# Patient Record
Sex: Female | Born: 1975 | Race: White | Hispanic: No | Marital: Married | Smoking: Former smoker
Health system: Southern US, Community
[De-identification: ages and names within clinical notes are randomized; demographics above are authoritative.]

## PROBLEM LIST (undated history)

## (undated) DIAGNOSIS — K219 Gastro-esophageal reflux disease without esophagitis: Secondary | ICD-10-CM

## (undated) DIAGNOSIS — E282 Polycystic ovarian syndrome: Secondary | ICD-10-CM

## (undated) DIAGNOSIS — Z87442 Personal history of urinary calculi: Secondary | ICD-10-CM

## (undated) DIAGNOSIS — R112 Nausea with vomiting, unspecified: Secondary | ICD-10-CM

## (undated) DIAGNOSIS — K8020 Calculus of gallbladder without cholecystitis without obstruction: Secondary | ICD-10-CM

## (undated) DIAGNOSIS — Z9889 Other specified postprocedural states: Secondary | ICD-10-CM

## (undated) HISTORY — PX: LITHOTRIPSY: SUR834

## (undated) HISTORY — PX: TUBAL LIGATION: SHX77

## (undated) HISTORY — PX: CHOLECYSTECTOMY: SHX55

## (undated) HISTORY — PX: ABLATION: SHX5711

## (undated) HISTORY — PX: DILATION AND CURETTAGE OF UTERUS: SHX78

## (undated) HISTORY — DX: Calculus of gallbladder without cholecystitis without obstruction: K80.20

---

## 1998-02-28 HISTORY — PX: CHOLECYSTECTOMY: SHX55

## 1998-05-15 ENCOUNTER — Other Ambulatory Visit: Admission: RE | Admit: 1998-05-15 | Discharge: 1998-05-15 | Payer: Self-pay | Admitting: Obstetrics & Gynecology

## 1999-01-23 ENCOUNTER — Emergency Department (HOSPITAL_COMMUNITY): Admission: EM | Admit: 1999-01-23 | Discharge: 1999-01-23 | Payer: Self-pay | Admitting: *Deleted

## 1999-01-26 ENCOUNTER — Emergency Department (HOSPITAL_COMMUNITY): Admission: EM | Admit: 1999-01-26 | Discharge: 1999-01-26 | Payer: Self-pay | Admitting: Emergency Medicine

## 1999-01-26 ENCOUNTER — Encounter: Payer: Self-pay | Admitting: Emergency Medicine

## 1999-01-27 ENCOUNTER — Encounter: Payer: Self-pay | Admitting: General Surgery

## 1999-01-27 ENCOUNTER — Encounter (INDEPENDENT_AMBULATORY_CARE_PROVIDER_SITE_OTHER): Payer: Self-pay

## 1999-01-27 ENCOUNTER — Observation Stay (HOSPITAL_COMMUNITY): Admission: RE | Admit: 1999-01-27 | Discharge: 1999-01-28 | Payer: Self-pay | Admitting: General Surgery

## 1999-10-08 ENCOUNTER — Other Ambulatory Visit: Admission: RE | Admit: 1999-10-08 | Discharge: 1999-10-08 | Payer: Self-pay | Admitting: Obstetrics and Gynecology

## 1999-10-19 ENCOUNTER — Encounter: Admission: RE | Admit: 1999-10-19 | Discharge: 1999-10-19 | Payer: Self-pay | Admitting: Urology

## 1999-10-19 ENCOUNTER — Encounter: Payer: Self-pay | Admitting: Urology

## 1999-10-26 ENCOUNTER — Encounter: Admission: RE | Admit: 1999-10-26 | Discharge: 1999-10-26 | Payer: Self-pay | Admitting: Urology

## 1999-10-26 ENCOUNTER — Encounter: Payer: Self-pay | Admitting: Urology

## 2000-11-07 ENCOUNTER — Other Ambulatory Visit: Admission: RE | Admit: 2000-11-07 | Discharge: 2000-11-07 | Payer: Self-pay | Admitting: Obstetrics and Gynecology

## 2001-03-04 ENCOUNTER — Emergency Department (HOSPITAL_COMMUNITY): Admission: EM | Admit: 2001-03-04 | Discharge: 2001-03-04 | Payer: Self-pay | Admitting: Emergency Medicine

## 2001-03-04 ENCOUNTER — Encounter: Payer: Self-pay | Admitting: Emergency Medicine

## 2001-07-22 ENCOUNTER — Emergency Department (HOSPITAL_COMMUNITY): Admission: EM | Admit: 2001-07-22 | Discharge: 2001-07-22 | Payer: Self-pay | Admitting: Emergency Medicine

## 2001-08-29 ENCOUNTER — Inpatient Hospital Stay (HOSPITAL_COMMUNITY): Admission: AD | Admit: 2001-08-29 | Discharge: 2001-08-29 | Payer: Self-pay | Admitting: Obstetrics and Gynecology

## 2001-11-01 ENCOUNTER — Other Ambulatory Visit: Admission: RE | Admit: 2001-11-01 | Discharge: 2001-11-01 | Payer: Self-pay | Admitting: Obstetrics and Gynecology

## 2002-06-12 ENCOUNTER — Encounter: Payer: Self-pay | Admitting: Orthodontics and Dentofacial Orthopedics

## 2002-06-12 ENCOUNTER — Ambulatory Visit (HOSPITAL_COMMUNITY)
Admission: RE | Admit: 2002-06-12 | Discharge: 2002-06-12 | Payer: Self-pay | Admitting: Orthodontics and Dentofacial Orthopedics

## 2002-06-23 ENCOUNTER — Emergency Department (HOSPITAL_COMMUNITY): Admission: EM | Admit: 2002-06-23 | Discharge: 2002-06-23 | Payer: Self-pay | Admitting: Emergency Medicine

## 2002-09-14 ENCOUNTER — Emergency Department (HOSPITAL_COMMUNITY): Admission: AD | Admit: 2002-09-14 | Discharge: 2002-09-14 | Payer: Self-pay | Admitting: Emergency Medicine

## 2002-09-14 ENCOUNTER — Encounter: Payer: Self-pay | Admitting: Emergency Medicine

## 2003-02-04 ENCOUNTER — Other Ambulatory Visit: Admission: RE | Admit: 2003-02-04 | Discharge: 2003-02-04 | Payer: Self-pay | Admitting: Obstetrics and Gynecology

## 2003-03-01 HISTORY — PX: TUBAL LIGATION: SHX77

## 2003-07-04 ENCOUNTER — Inpatient Hospital Stay (HOSPITAL_COMMUNITY): Admission: AD | Admit: 2003-07-04 | Discharge: 2003-07-04 | Payer: Self-pay | Admitting: Obstetrics and Gynecology

## 2003-08-06 ENCOUNTER — Inpatient Hospital Stay (HOSPITAL_COMMUNITY): Admission: AD | Admit: 2003-08-06 | Discharge: 2003-08-06 | Payer: Self-pay | Admitting: Obstetrics and Gynecology

## 2003-10-13 ENCOUNTER — Inpatient Hospital Stay (HOSPITAL_COMMUNITY): Admission: AD | Admit: 2003-10-13 | Discharge: 2003-10-13 | Payer: Self-pay | Admitting: Obstetrics and Gynecology

## 2003-10-21 ENCOUNTER — Inpatient Hospital Stay (HOSPITAL_COMMUNITY): Admission: AD | Admit: 2003-10-21 | Discharge: 2003-10-23 | Payer: Self-pay | Admitting: Obstetrics and Gynecology

## 2003-10-22 ENCOUNTER — Encounter (INDEPENDENT_AMBULATORY_CARE_PROVIDER_SITE_OTHER): Payer: Self-pay | Admitting: Specialist

## 2003-12-08 ENCOUNTER — Other Ambulatory Visit: Admission: RE | Admit: 2003-12-08 | Discharge: 2003-12-08 | Payer: Self-pay | Admitting: Obstetrics and Gynecology

## 2004-06-21 ENCOUNTER — Ambulatory Visit (HOSPITAL_COMMUNITY): Admission: RE | Admit: 2004-06-21 | Discharge: 2004-06-21 | Payer: Self-pay | Admitting: Urology

## 2005-01-11 ENCOUNTER — Emergency Department (HOSPITAL_COMMUNITY): Admission: EM | Admit: 2005-01-11 | Discharge: 2005-01-11 | Payer: Self-pay | Admitting: Emergency Medicine

## 2007-07-02 ENCOUNTER — Emergency Department (HOSPITAL_COMMUNITY): Admission: EM | Admit: 2007-07-02 | Discharge: 2007-07-02 | Payer: Self-pay | Admitting: Emergency Medicine

## 2007-08-18 ENCOUNTER — Emergency Department (HOSPITAL_COMMUNITY): Admission: EM | Admit: 2007-08-18 | Discharge: 2007-08-18 | Payer: Self-pay | Admitting: Family Medicine

## 2008-09-01 ENCOUNTER — Emergency Department (HOSPITAL_COMMUNITY): Admission: EM | Admit: 2008-09-01 | Discharge: 2008-09-01 | Payer: Self-pay | Admitting: Emergency Medicine

## 2008-11-07 ENCOUNTER — Encounter (INDEPENDENT_AMBULATORY_CARE_PROVIDER_SITE_OTHER): Payer: Self-pay | Admitting: Obstetrics and Gynecology

## 2008-11-07 ENCOUNTER — Ambulatory Visit (HOSPITAL_COMMUNITY): Admission: RE | Admit: 2008-11-07 | Discharge: 2008-11-07 | Payer: Self-pay | Admitting: Obstetrics and Gynecology

## 2009-10-24 ENCOUNTER — Emergency Department (HOSPITAL_COMMUNITY): Admission: EM | Admit: 2009-10-24 | Discharge: 2009-10-24 | Payer: Self-pay | Admitting: Emergency Medicine

## 2010-05-13 LAB — CULTURE, ROUTINE-ABSCESS: Gram Stain: NONE SEEN

## 2010-06-04 LAB — CBC
MCHC: 33.5 g/dL (ref 30.0–36.0)
MCV: 93.6 fL (ref 78.0–100.0)
Platelets: 302 10*3/uL (ref 150–400)
WBC: 11.5 10*3/uL — ABNORMAL HIGH (ref 4.0–10.5)

## 2010-06-04 LAB — PREGNANCY, URINE: Preg Test, Ur: NEGATIVE

## 2010-07-16 NOTE — Op Note (Signed)
Melody Taylor, Melody Taylor                            ACCOUNT NO.:  [id number]   MEDICAL RECORD NO.:  [id number]                   PATIENT TYPE:  INP   LOCATION:  9102                                 FACILITY:  [facility]   PHYSICIAN:  Freddy Finner, M.D.                DATE OF BIRTH:  18-Apr-1975   DATE OF PROCEDURE:  10/22/2003  DATE OF DISCHARGE:  10/23/2003                                 OPERATIVE REPORT   PREOPERATIVE DIAGNOSIS:  Multiparity, requests postpartum tubal  sterilization.   POSTOPERATIVE DIAGNOSES:  1.  Multiparity, requests postpartum tubal sterilization.  2.  Adnexal adhesions.   OPERATIVE PROCEDURE:  Postpartum tubal ligation by Pomeroy technique with  lysis of adhesions to free up fallopian tubes.   SURGEON:  Varney Baas, M.D.   ANESTHESIA:  Epidural with IV sedation plus local block.   INTRAOPERATIVE COMPLICATIONS:  None.   The patient is a 35 year old who has requested postpartum sterilization.  This is her third viable pregnancy.  She was brought to the operating room  on the day after delivery and her epidural was redosed for surgery.  This  was partially effective and local block was carried out at the umbilicus  after prepped and draped the abdomen in the usual fashion.  The bladder was  evacuated prior to prepping the patient.  An elliptical incision was made in  the inferior umbilicus through an old scar.  This was carried sharply down  to fascia which was elevated with Allises and then and entered sharply.  The  peritoneum was also entered.  Some difficulty was experienced in exposing  the tubes.  This was felt to be due to adhesions holding the tube down  against the adnexa.  This was freed up with sharp and blunt dissection.  Neither ovary was palpably enlarged but was not easily visible, probably  also because of adhesions.  After freeing each tube up, the mid portion of  the tube was elevated with a Babcock, doubly ligated with 0 plain ties x2,  and a  segment of tube excised.  The mucosal segments of the tube distal to  the tie were fulgurated with the Bovie on each side.  The abdominal incision  was closed in layers.  A 0 chromic was used to close the peritoneum and  fascia in a single layer.  The skin was closed with a subcuticular suture of  3-0 Dexon.  The patient was taken to recovery in good condition.                                               Freddy Finner, M.D.    WRN/MEDQ  D:  11/05/2003  T:  11/05/2003  Job:  [id number]

## 2010-08-16 ENCOUNTER — Emergency Department (HOSPITAL_COMMUNITY)
Admission: EM | Admit: 2010-08-16 | Discharge: 2010-08-17 | Disposition: A | Discharge status: Home / Self Care | Payer: Self-pay | Attending: Emergency Medicine | Admitting: Emergency Medicine

## 2010-08-16 ENCOUNTER — Emergency Department (HOSPITAL_COMMUNITY): Payer: Self-pay

## 2010-08-16 DIAGNOSIS — R509 Fever, unspecified: Secondary | ICD-10-CM | POA: Insufficient documentation

## 2010-08-16 DIAGNOSIS — H9209 Otalgia, unspecified ear: Secondary | ICD-10-CM | POA: Insufficient documentation

## 2010-08-16 DIAGNOSIS — J029 Acute pharyngitis, unspecified: Secondary | ICD-10-CM | POA: Insufficient documentation

## 2010-08-16 DIAGNOSIS — R51 Headache: Secondary | ICD-10-CM | POA: Insufficient documentation

## 2010-08-16 LAB — DIFFERENTIAL
Basophils Relative: 0 % (ref 0–1)
Eosinophils Absolute: 0 10*3/uL (ref 0.0–0.7)
Lymphocytes Relative: 3 % — ABNORMAL LOW (ref 12–46)
Lymphs Abs: 0.5 10*3/uL — ABNORMAL LOW (ref 0.7–4.0)
Monocytes Relative: 6 % (ref 3–12)
Neutro Abs: 13.4 10*3/uL — ABNORMAL HIGH (ref 1.7–7.7)
Neutrophils Relative %: 90 % — ABNORMAL HIGH (ref 43–77)

## 2010-08-16 LAB — CK TOTAL AND CKMB (NOT AT ARMC)
CK, MB: 0.9 ng/mL (ref 0.3–4.0)
Total CK: 46 U/L (ref 7–177)

## 2010-08-16 LAB — CBC
Hemoglobin: 13 g/dL (ref 12.0–15.0)
MCH: 30 pg (ref 26.0–34.0)
MCHC: 34.3 g/dL (ref 30.0–36.0)
MCV: 87.3 fL (ref 78.0–100.0)
Platelets: 270 10*3/uL (ref 150–400)
RBC: 4.34 MIL/uL (ref 3.87–5.11)
RDW: 13.2 % (ref 11.5–15.5)

## 2010-08-16 LAB — BASIC METABOLIC PANEL
BUN: 8 mg/dL (ref 6–23)
CO2: 25 mEq/L (ref 19–32)
Creatinine, Ser: 0.6 mg/dL (ref 0.50–1.10)
GFR calc non Af Amer: >60 mL/min (ref >60)
Glucose, Bld: 104 mg/dL — ABNORMAL HIGH (ref 70–99)
Sodium: 137 mEq/L (ref 135–145)

## 2010-08-16 LAB — TROPONIN I: Troponin I: <0.3 ng/mL (ref <0.30)

## 2011-05-24 ENCOUNTER — Encounter (HOSPITAL_COMMUNITY): Payer: Self-pay | Admitting: *Deleted

## 2011-05-24 ENCOUNTER — Emergency Department (HOSPITAL_COMMUNITY)
Admission: EM | Admit: 2011-05-24 | Discharge: 2011-05-24 | Discharge status: Against Medical Advice | Payer: Self-pay | Attending: Emergency Medicine | Admitting: Emergency Medicine

## 2011-05-24 DIAGNOSIS — R109 Unspecified abdominal pain: Secondary | ICD-10-CM | POA: Insufficient documentation

## 2011-05-24 LAB — URINALYSIS, ROUTINE W REFLEX MICROSCOPIC
Glucose, UA: NEGATIVE mg/dL
Ketones, ur: NEGATIVE mg/dL
Protein, ur: NEGATIVE mg/dL
pH: 6 (ref 5.0–8.0)

## 2011-05-24 LAB — URINE MICROSCOPIC-ADD ON

## 2011-05-24 NOTE — ED Notes (Signed)
Patient reports onset of pain in her right side at 1345.  She states the pain has increased.  She has pain in her right groin and into her back.  Patient states she has hx of kidney stones.  She has not had problems for 8 years.  Patient denies diff voiding

## 2011-06-28 ENCOUNTER — Other Ambulatory Visit: Payer: Self-pay | Admitting: Obstetrics and Gynecology

## 2011-10-01 ENCOUNTER — Emergency Department (HOSPITAL_COMMUNITY)
Admission: EM | Admit: 2011-10-01 | Discharge: 2011-10-01 | Disposition: A | Discharge status: Home / Self Care | Payer: Self-pay | Attending: Emergency Medicine | Admitting: Emergency Medicine

## 2011-10-01 ENCOUNTER — Emergency Department (HOSPITAL_COMMUNITY): Payer: Self-pay

## 2011-10-01 ENCOUNTER — Encounter (HOSPITAL_COMMUNITY): Payer: Self-pay | Admitting: *Deleted

## 2011-10-01 DIAGNOSIS — S62329A Displaced fracture of shaft of unspecified metacarpal bone, initial encounter for closed fracture: Secondary | ICD-10-CM | POA: Insufficient documentation

## 2011-10-01 DIAGNOSIS — Z87442 Personal history of urinary calculi: Secondary | ICD-10-CM | POA: Insufficient documentation

## 2011-10-01 DIAGNOSIS — W010XXA Fall on same level from slipping, tripping and stumbling without subsequent striking against object, initial encounter: Secondary | ICD-10-CM | POA: Insufficient documentation

## 2011-10-01 DIAGNOSIS — Z9089 Acquired absence of other organs: Secondary | ICD-10-CM | POA: Insufficient documentation

## 2011-10-01 DIAGNOSIS — Y92009 Unspecified place in unspecified non-institutional (private) residence as the place of occurrence of the external cause: Secondary | ICD-10-CM | POA: Insufficient documentation

## 2011-10-01 MED ORDER — IBUPROFEN 800 MG PO TABS
800.0000 mg | ORAL_TABLET | Freq: Once | ORAL | Status: AC
  Administered 2011-10-01: 800 mg via ORAL
  Filled 2011-10-01: qty 1

## 2011-10-01 NOTE — ED Notes (Signed)
Ortho tech at bedside 

## 2011-10-01 NOTE — Progress Notes (Signed)
Orthopedic Tech Progress Note Patient Details:  Melody Taylor 12/11/1975 [id number]  Ortho Devices Type of Ortho Device: Ulna gutter splint;Ace wrap Ortho Device/Splint Location: left UE Ortho Device/Splint Interventions: Application   [name] 10/01/2011, 9:42 AM

## 2011-10-01 NOTE — ED Provider Notes (Signed)
Medical screening examination/treatment/procedure(s) were performed by non-physician practitioner and as supervising physician I was immediately available for consultation/collaboration.   David H Yao, MD 10/01/11 1009 

## 2011-10-01 NOTE — ED Notes (Signed)
Reports falling this am, having pain to left hand, ring finger and little finger, swelling noted. Pt reports unable to get her rings off pta, requesting that they be cut off at triage.

## 2011-10-01 NOTE — ED Provider Notes (Signed)
History     CSN: [id number]  Arrival date & time 10/01/11  0808   First MD Initiated Contact with Patient 10/01/11 [phone]      Chief Complaint  Patient presents with  . Hand Injury    (Consider location/radiation/quality/duration/timing/severity/associated sxs/prior treatment) HPI  Past Medical History  Diagnosis Date  . Kidney stones     Past Surgical History  Procedure Date  . Cholecystectomy   . Tubal ligation   . Tubal ligation   . Dilation and curettage of uterus     History reviewed. No pertinent family history.  History  Substance Use Topics  . Smoking status: Never Smoker   . Smokeless tobacco: Not on file  . Alcohol Use: No    OB History    Grav Para Term Preterm Abortions TAB SAB Ect Mult Living                  Review of Systems  Allergies  Codeine  Home Medications   Current Outpatient Rx  Name Route Sig Dispense Refill  . CETIRIZINE HCL 10 MG PO TABS Oral Take 10 mg by mouth daily.      BP 109/65  Pulse 95  Temp 98 F (36.7 C) (Oral)  Resp 18  SpO2 99%  LMP 09/24/2011  Physical Exam  ED Course  Procedures (including critical care time)  Labs Reviewed - No data to display Dg Hand Complete Left  10/01/2011  *RADIOLOGY REPORT*  Clinical Data: Fall, left hand pain/injury  LEFT HAND - COMPLETE 3+ VIEW  Comparison: None.  Findings: Mildly comminuted, nondisplaced fracture involving the fifth metacarpal shaft.  No additional fracture is seen.  The joint spaces are preserved.  Visualized soft tissues are grossly unremarkable.  IMPRESSION: Mildly comminuted, nondisplaced fracture involving the fifth metacarpal shaft.  Original Report Authenticated By: Charline Bills, M.D.     1. Fracture, metacarpal shaft       MDM  Please see midlevel note.         Richardean Canal, MD 10/01/11 [phone]
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
## 2012-02-06 ENCOUNTER — Emergency Department (HOSPITAL_COMMUNITY)
Admission: EM | Admit: 2012-02-06 | Discharge: 2012-02-06 | Disposition: A | Discharge status: Home / Self Care | Payer: Self-pay | Attending: Emergency Medicine | Admitting: Emergency Medicine

## 2012-02-06 ENCOUNTER — Emergency Department (HOSPITAL_COMMUNITY): Payer: Self-pay

## 2012-02-06 ENCOUNTER — Encounter (HOSPITAL_COMMUNITY): Payer: Self-pay | Admitting: Emergency Medicine

## 2012-02-06 DIAGNOSIS — R110 Nausea: Secondary | ICD-10-CM | POA: Insufficient documentation

## 2012-02-06 DIAGNOSIS — K219 Gastro-esophageal reflux disease without esophagitis: Secondary | ICD-10-CM | POA: Insufficient documentation

## 2012-02-06 DIAGNOSIS — Z79899 Other long term (current) drug therapy: Secondary | ICD-10-CM | POA: Insufficient documentation

## 2012-02-06 DIAGNOSIS — Z87442 Personal history of urinary calculi: Secondary | ICD-10-CM | POA: Insufficient documentation

## 2012-02-06 DIAGNOSIS — Z3202 Encounter for pregnancy test, result negative: Secondary | ICD-10-CM | POA: Insufficient documentation

## 2012-02-06 LAB — CBC WITH DIFFERENTIAL/PLATELET
Basophils Absolute: 0 10*3/uL (ref 0.0–0.1)
Basophils Relative: 0 % (ref 0–1)
Eosinophils Absolute: 0.1 10*3/uL (ref 0.0–0.7)
HCT: 38.6 % (ref 36.0–46.0)
MCH: 29.8 pg (ref 26.0–34.0)
MCHC: 33.2 g/dL (ref 30.0–36.0)
Monocytes Relative: 4 % (ref 3–12)
Neutro Abs: 6.8 10*3/uL (ref 1.7–7.7)
Neutrophils Relative %: 75 % (ref 43–77)
Platelets: 344 10*3/uL (ref 150–400)
RDW: 12.9 % (ref 11.5–15.5)
WBC: 9 10*3/uL (ref 4.0–10.5)

## 2012-02-06 LAB — COMPREHENSIVE METABOLIC PANEL
AST: 12 U/L (ref 0–37)
Albumin: 3.2 g/dL — ABNORMAL LOW (ref 3.5–5.2)
Alkaline Phosphatase: 69 U/L (ref 39–117)
BUN: 9 mg/dL (ref 6–23)
Chloride: 106 mEq/L (ref 96–112)
Creatinine, Ser: 0.61 mg/dL (ref 0.50–1.10)
GFR calc Af Amer: >90 mL/min (ref >90)
Glucose, Bld: 110 mg/dL — ABNORMAL HIGH (ref 70–99)
Potassium: 3.7 mEq/L (ref 3.5–5.1)
Total Protein: 6.9 g/dL (ref 6.0–8.3)

## 2012-02-06 LAB — TROPONIN I: Troponin I: <0.3 ng/mL (ref <0.30)

## 2012-02-06 LAB — URINALYSIS, ROUTINE W REFLEX MICROSCOPIC
Nitrite: NEGATIVE
Specific Gravity, Urine: 1.018 (ref 1.005–1.030)
Urobilinogen, UA: 0.2 mg/dL (ref 0.0–1.0)
pH: 6 (ref 5.0–8.0)

## 2012-02-06 LAB — URINE MICROSCOPIC-ADD ON

## 2012-02-06 LAB — POCT PREGNANCY, URINE: Preg Test, Ur: NEGATIVE

## 2012-02-06 LAB — LIPASE, BLOOD: Lipase: 15 U/L (ref 11–59)

## 2012-02-06 MED ORDER — PANTOPRAZOLE SODIUM 20 MG PO TBEC: 40.0000 mg | DELAYED_RELEASE_TABLET | Freq: Every day | ORAL | Status: DC

## 2012-02-06 MED ORDER — ONDANSETRON HCL 4 MG PO TABS: 4.0000 mg | ORAL_TABLET | Freq: Four times a day (QID) | ORAL | Status: DC

## 2012-02-06 MED ORDER — GI COCKTAIL ~~LOC~~
30.0000 mL | Freq: Once | ORAL | Status: AC
  Administered 2012-02-06: 30 mL via ORAL
  Filled 2012-02-06: qty 30

## 2012-02-06 MED ORDER — MORPHINE SULFATE 4 MG/ML IJ SOLN: 4.0000 mg | Freq: Once | INTRAMUSCULAR | Status: DC

## 2012-02-06 MED ORDER — GI COCKTAIL ~~LOC~~: 30.0000 mL | Freq: Two times a day (BID) | ORAL | Status: DC

## 2012-02-06 MED ORDER — ONDANSETRON 4 MG PO TBDP
8.0000 mg | ORAL_TABLET | Freq: Once | ORAL | Status: AC
  Administered 2012-02-06: 8 mg via ORAL
  Filled 2012-02-06: qty 2

## 2012-02-06 NOTE — ED Notes (Signed)
Pt c/o 3 weeks of epigastric pain radiating around sides with nausea and dry heaves. Pain is intermittent and nausea is constant. Pt seen by PMD on Friday for same.

## 2012-02-06 NOTE — ED Notes (Signed)
Went into Patient room to do ECG.  Lab was in with drawing blood.  Patient has gone to [employer] [facility]

## 2012-02-06 NOTE — ED Provider Notes (Signed)
History     CSN: [id number]  Arrival date & time 02/06/12  4098   First MD Initiated Contact with Patient 02/06/12 [phone]      Chief Complaint  Patient presents with  . Abdominal Pain  . Nausea    (Consider location/radiation/quality/duration/timing/severity/associated sxs/prior treatment) The history is provided by the patient and medical records.    Melody Taylor is a 36 y.o. female  with a hx of kidney stones presents to the Emergency Department complaining of gradual, persistent, progressively worsening epigastric pain onset 3 weeks ago. Associated symptoms include nausea, acid wash, burning in the chest, feeling the need to vomit without actually doing so .  Nothing makes it better and coffee and some foods make it worse. Pt PCP gave Zofran which helps temporarily with nausea, but not other symptoms and Protonix.  Pt was told the protonix was pain medication and did not take any of it.  Also, she has taken no medication for any symptoms in the past 72 hours since they were not helping.   Pt denies cough, congestion, shortness of breath bleeding, h/x of anemia, early satiety, unexplained weight loss, progressive dysphagia, odynophagia, recurrent vomiting, family history of GI cancer, previous esophagogastric malignancy.  Pt describes her pain as a "knot" in her stomach, rated at a 6/10, non-radiating.  Pt is a nonsmoker, no Fhx of cancer.    Past Medical History  Diagnosis Date  . Kidney stones     Past Surgical History  Procedure Date  . Cholecystectomy   . Tubal ligation   . Tubal ligation   . Dilation and curettage of uterus   . Ablation     No family history on file.  History  Substance Use Topics  . Smoking status: Never Smoker   . Smokeless tobacco: Not on file  . Alcohol Use: No    OB History    Grav Para Term Preterm Abortions TAB SAB Ect Mult Living                  Review of Systems  Constitutional: Negative for fever, diaphoresis, appetite change,  fatigue and unexpected weight change.  HENT: Positive for sore throat. Negative for ear pain, congestion, rhinorrhea, mouth sores, neck pain, neck stiffness and postnasal drip.   Eyes: Negative for visual disturbance.  Respiratory: Negative for cough, chest tightness, shortness of breath and wheezing.   Cardiovascular: Negative for chest pain.       Burning sensation in her chest  Gastrointestinal: Positive for nausea and abdominal pain. Negative for vomiting, diarrhea and constipation.  Genitourinary: Negative for dysuria, urgency, frequency and hematuria.  Skin: Negative for rash.  Neurological: Negative for syncope, light-headedness and headaches.  Psychiatric/Behavioral: Negative for sleep disturbance. The patient is not nervous/anxious.     Allergies  Review of patient's allergies indicates no active allergies.  Home Medications   Current Outpatient Rx  Name  Route  Sig  Dispense  Refill  . GAVISCON EXTRA STRENGTH PO   Oral   Take 5 mLs by mouth 2 (two) times daily as needed. For indigestion         . METOCLOPRAMIDE HCL 5 MG PO TABS   Oral   Take 5 mg by mouth every 8 (eight) hours as needed. For nausea         . ONDANSETRON HCL 8 MG PO TABS   Oral   Take by mouth every 8 (eight) hours as needed. For nausea         .  ONDANSETRON 8 MG PO FILM   Oral   Take 1 each by mouth every 8 (eight) hours as needed. For nausea         . PANTOPRAZOLE SODIUM 40 MG PO TBEC   Oral   Take 40 mg by mouth daily.           BP 104/69  Pulse 64  Temp 99 F (37.2 C) (Oral)  Resp 21  SpO2 99%  Physical Exam  Nursing note and vitals reviewed. Constitutional: She is oriented to person, place, and time. She appears well-developed and well-nourished. No distress.  HENT:  Head: Normocephalic and atraumatic.  Right Ear: Tympanic membrane, external ear and ear canal normal.  Left Ear: Tympanic membrane, external ear and ear canal normal.  Nose: Nose normal. Right sinus exhibits  no maxillary sinus tenderness and no frontal sinus tenderness. Left sinus exhibits no maxillary sinus tenderness and no frontal sinus tenderness.  Mouth/Throat: Uvula is midline. Mucous membranes are not pale, not dry and not cyanotic. Posterior oropharyngeal erythema present. No oropharyngeal exudate, posterior oropharyngeal edema or tonsillar abscesses.  Eyes: Conjunctivae normal are normal. Pupils are equal, round, and reactive to light. No scleral icterus.  Neck: Normal range of motion. Neck supple.  Cardiovascular: Normal rate, regular rhythm, normal heart sounds and intact distal pulses.  Exam reveals no gallop and no friction rub.   No murmur heard. Pulmonary/Chest: Effort normal and breath sounds normal. No respiratory distress. She has no wheezes. She has no rales. She exhibits no tenderness.  Abdominal: Soft. Normal appearance and bowel sounds are normal. She exhibits no mass. There is tenderness (mild) in the epigastric area. There is no rigidity, no rebound, no guarding, no CVA tenderness, no tenderness at McBurney's point and negative Murphy's sign.  Musculoskeletal: Normal range of motion. She exhibits no edema and no tenderness.  Lymphadenopathy:    She has no cervical adenopathy.  Neurological: She is alert and oriented to person, place, and time. She exhibits normal muscle tone. Coordination normal.       Speech is clear and goal oriented Moves extremities without ataxia  Skin: Skin is warm and dry. No rash noted. She is not diaphoretic. No erythema.  Psychiatric: She has a normal mood and affect.    ED Course  Procedures (including critical care time)  Labs Reviewed  COMPREHENSIVE METABOLIC PANEL - Abnormal; Notable for the following:    Glucose, Bld 110 (*)     Albumin 3.2 (*)     Total Bilirubin 0.2 (*)     All other components within normal limits  URINALYSIS, ROUTINE W REFLEX MICROSCOPIC - Abnormal; Notable for the following:    Hgb urine dipstick SMALL (*)      Leukocytes, UA SMALL (*)     All other components within normal limits  URINE MICROSCOPIC-ADD ON - Abnormal; Notable for the following:    Squamous Epithelial / LPF FEW (*)     All other components within normal limits  CBC WITH DIFFERENTIAL  LIPASE, BLOOD  TROPONIN I  POCT PREGNANCY, URINE  URINE CULTURE   Results for orders placed during the hospital encounter of 02/06/12  CBC WITH DIFFERENTIAL      Component Value Range   WBC 9.0  4.0 - 10.5 K/uL   RBC 4.30  3.87 - 5.11 MIL/uL   Hemoglobin 12.8  12.0 - 15.0 g/dL   HCT 16.1  09.6 - 04.5 %   MCV 89.8  78.0 - 100.0 fL   MCH  29.8  26.0 - 34.0 pg   MCHC 33.2  30.0 - 36.0 g/dL   RDW 21.3  08.6 - 57.8 %   Platelets 344  150 - 400 K/uL   Neutrophils Relative 75  43 - 77 %   Neutro Abs 6.8  1.7 - 7.7 K/uL   Lymphocytes Relative 19  12 - 46 %   Lymphs Abs 1.7  0.7 - 4.0 K/uL   Monocytes Relative 4  3 - 12 %   Monocytes Absolute 0.4  0.1 - 1.0 K/uL   Eosinophils Relative 1  0 - 5 %   Eosinophils Absolute 0.1  0.0 - 0.7 K/uL   Basophils Relative 0  0 - 1 %   Basophils Absolute 0.0  0.0 - 0.1 K/uL  COMPREHENSIVE METABOLIC PANEL      Component Value Range   Sodium 139  135 - 145 mEq/L   Potassium 3.7  3.5 - 5.1 mEq/L   Chloride 106  96 - 112 mEq/L   CO2 24  19 - 32 mEq/L   Glucose, Bld 110 (*) 70 - 99 mg/dL   BUN 9  6 - 23 mg/dL   Creatinine, Ser 4.69  0.50 - 1.10 mg/dL   Calcium 8.8  8.4 - 62.9 mg/dL   Total Protein 6.9  6.0 - 8.3 g/dL   Albumin 3.2 (*) 3.5 - 5.2 g/dL   AST 12  0 - 37 U/L   ALT 21  0 - 35 U/L   Alkaline Phosphatase 69  39 - 117 U/L   Total Bilirubin 0.2 (*) 0.3 - 1.2 mg/dL   GFR calc non Af Amer >90  >90 mL/min   GFR calc Af Amer >90  >90 mL/min  LIPASE, BLOOD      Component Value Range   Lipase 15  11 - 59 U/L  TROPONIN I      Component Value Range   Troponin I <0.30  <0.30 ng/mL  URINALYSIS, ROUTINE W REFLEX MICROSCOPIC      Component Value Range   Color, Urine YELLOW  YELLOW   APPearance CLEAR   CLEAR   Specific Gravity, Urine 1.018  1.005 - 1.030   pH 6.0  5.0 - 8.0   Glucose, UA NEGATIVE  NEGATIVE mg/dL   Hgb urine dipstick SMALL (*) NEGATIVE   Bilirubin Urine NEGATIVE  NEGATIVE   Ketones, ur NEGATIVE  NEGATIVE mg/dL   Protein, ur NEGATIVE  NEGATIVE mg/dL   Urobilinogen, UA 0.2  0.0 - 1.0 mg/dL   Nitrite NEGATIVE  NEGATIVE   Leukocytes, UA SMALL (*) NEGATIVE  POCT PREGNANCY, URINE      Component Value Range   Preg Test, Ur NEGATIVE  NEGATIVE  URINE MICROSCOPIC-ADD ON      Component Value Range   Squamous Epithelial / LPF FEW (*) RARE   WBC, UA 3-6  <3 WBC/hpf   RBC / HPF 0-2  <3 RBC/hpf   Bacteria, UA RARE  RARE   Dg Chest 2 View  02/06/2012  *RADIOLOGY REPORT*  Clinical Data: Mid chest and epigastric discomfort with nausea for 3 weeks.  Shortness of breath.  CHEST - 2 VIEW  Comparison: 08/16/2010 radiographs.  Findings:  The heart size and mediastinal contours are normal. The lungs are clear. There is no pleural effusion or pneumothorax. No acute osseous findings are identified.  IMPRESSION: Stable examination.  No active cardiopulmonary process.   Original Report Authenticated By: Carey Bullocks, M.D.    ECG:  Date: 02/06/2012  Rate: 68  Rhythm: normal sinus rhythm  QRS Axis: normal  Intervals: normal  ST/T Wave abnormalities: normal  Conduction Disutrbances:none  Narrative Interpretation: non-ischemic ECG  Old EKG Reviewed: changes noted and not tachycardic     1. Nausea alone   2. GERD (gastroesophageal reflux disease)       MDM  Melody Taylor presents with ssx of GERD.  Also, with the possibility of PUD vs H. [place].  GI cocktail and zofran with resolution of epigastric pain and nausea, but not of the "knot" feeling in her stomach.  Labs unremarkable, ECG non-ischemic.  Pt is not anemic, with low concern for bleeding ulcer.  No red flags for stricture, barrett's esophagus or significant esophagitis.  Likely uncontrolled GERD ssx 2/2 the nonuse of a PPI.   Discussed use of PPI, diet changes, exercise and weight loss.  Also discussed GI follow-up with potential for EGD and H. Pylori testing.  I have also discussed reasons to return immediately to the ER.  Patient expresses understanding and agrees with plan.  1. Medications: protonix, Zofran, Tums, usual home medications 2. Treatment: rest, drink plenty of fluids, take medications,  3. Follow Up: Please followup with your primary doctor for discussion of your diagnoses and further evaluation after today's visit; if you do not have a primary care doctor use the resource guide provided to find one; GI referral for further eval of GERD ssx       Dierdre Forth, PA-C 02/06/12 1253

## 2012-02-07 ENCOUNTER — Encounter: Payer: Self-pay | Admitting: Internal Medicine

## 2012-02-07 LAB — URINE CULTURE
Colony Count: NO GROWTH
Culture: NO GROWTH

## 2012-02-07 NOTE — ED Provider Notes (Signed)
Medical screening examination/treatment/procedure(s) were performed by non-physician practitioner and as supervising physician I was immediately available for consultation/collaboration.   Suzi Roots, MD 02/07/12 [phone]

## 2012-03-01 ENCOUNTER — Encounter: Payer: Self-pay | Admitting: Internal Medicine

## 2012-03-06 ENCOUNTER — Encounter: Payer: Self-pay | Admitting: Internal Medicine

## 2012-03-06 ENCOUNTER — Ambulatory Visit (INDEPENDENT_AMBULATORY_CARE_PROVIDER_SITE_OTHER): Payer: Self-pay | Admitting: Internal Medicine

## 2012-03-06 VITALS — BP 118/78 | HR 78 | Ht 69.0 in | Wt 196.6 lb

## 2012-03-06 DIAGNOSIS — Z9049 Acquired absence of other specified parts of digestive tract: Secondary | ICD-10-CM | POA: Insufficient documentation

## 2012-03-06 DIAGNOSIS — Z87442 Personal history of urinary calculi: Secondary | ICD-10-CM | POA: Insufficient documentation

## 2012-03-06 DIAGNOSIS — K219 Gastro-esophageal reflux disease without esophagitis: Secondary | ICD-10-CM

## 2012-03-06 DIAGNOSIS — R1013 Epigastric pain: Secondary | ICD-10-CM

## 2012-03-06 MED ORDER — PROMETHAZINE HCL 12.5 MG PO TABS: 12.5000 mg | ORAL_TABLET | Freq: Four times a day (QID) | ORAL | Status: DC | PRN

## 2012-03-06 MED ORDER — PANTOPRAZOLE SODIUM 40 MG PO TBEC: 40.0000 mg | DELAYED_RELEASE_TABLET | Freq: Two times a day (BID) | ORAL | Status: DC

## 2012-03-06 NOTE — Patient Instructions (Addendum)
You have been scheduled for an endoscopy with propofol. Please follow written instructions given to you at your visit today. If you use inhalers (even only as needed) or a CPAP machine, please bring them with you on the day of your procedure.   We have sent the following medications to your pharmacy for you to pick up at your convenience: Protonix; take twice a day, Promethazine; take as directed  Stop taking Zofran and Reglan

## 2012-03-06 NOTE — Progress Notes (Signed)
Patient ID: Melody Taylor, female   DOB: 10-31-1975, 37 y.o.   MRN: [id number]  SUBJECTIVE: HPI Melody Taylor is a 37 year old female with a past medical history of kidney stones and gallstones status post cholecystectomy in 2000 who is seen in consultation at request of Dr. Margretta Ditty for evaluation of epigastric abdominal pain and heartburn.  The patient reports her symptoms started around Thanksgiving of 2013. Prior to this heartburn and dyspepsia were not a big issue for her. Initially her symptoms started his nausea and progressed epigastric abdominal pain. She reports epigastric pain is felt like "fire". She has not ever had an issue with vomiting. Her symptoms have been worse with eating, and in fact she has avoided food to try to avoid the symptoms. Her sleep has been corrected by the burning epigastric pain.  Initially she was given a prescription for pantoprazole as well as GI cocktail which seemed to help, but over the last week it has lost its efficacy. She is taking pantoprazole 40 mg daily. She does report use of Excedrin occasionally for headaches. She was also given prescriptions for ondansetron metoclopramide for nausea, but she has not found these helpful, but only sedating. Her bowel habits have been regular without blood or melena. No fevers or chills. No mouth ulcers. No rashes. When asked, her symptoms do resemble her prior gallbladder dysfunction, but seem to be more severe.  Review of Systems  As per history of present illness, otherwise negative   Past Medical History  Diagnosis Date  . Kidney stones   . Gallstones     Current Outpatient Prescriptions  Medication Sig Dispense Refill  . Alum & Mag Hydroxide-Simeth (GI COCKTAIL) SUSP suspension Take 30 mLs by mouth 2 (two) times daily. Shake well.  30 mL  1  . Alum Hydroxide-Mag Carbonate (GAVISCON EXTRA STRENGTH PO) Take 5 mLs by mouth 2 (two) times daily as needed. For indigestion      . pantoprazole (PROTONIX) 40 MG tablet  Take 1 tablet (40 mg total) by mouth 2 (two) times daily.  60 tablet  3  . promethazine (PHENERGAN) 12.5 MG tablet Take 1 tablet (12.5 mg total) by mouth every 6 (six) hours as needed for nausea.  30 tablet  0  . sertraline (ZOLOFT) 25 MG tablet Take 25 mg by mouth daily.        No Active Allergies  Family History  Problem Relation Age of Onset  . Diabetes Maternal Aunt   . Heart disease Maternal Grandfather   . Heart disease Maternal Uncle     x 2  . Irritable bowel syndrome Mother   . Cancer Maternal Aunt     kidney    History  Substance Use Topics  . Smoking status: Former Smoker    Quit date: 03/06/2010  . Smokeless tobacco: Never Used  . Alcohol Use: No    OBJECTIVE: BP 118/78  Pulse 78  Ht 5\' 9"  (1.753 m)  Wt 196 lb 9.6 oz (89.177 kg)  BMI 29.03 kg/m2  SpO2 99% Constitutional: Well-developed and well-nourished. No distress. HEENT: Normocephalic and atraumatic. Oropharynx is clear and moist. No oropharyngeal exudate. Conjunctivae are normal. No scleral icterus. Neck: Neck supple. Trachea midline. Cardiovascular: Normal rate, regular rhythm and intact distal pulses. No M/R/G Pulmonary/chest: Effort normal and breath sounds normal. No wheezing, rales or rhonchi. Abdominal: Soft, epigastric abdominal pain without rebound or guarding, nondistended. Bowel sounds active throughout. There are no masses palpable. No hepatosplenomegaly. Well-healed laparoscopic scar Extremities: no clubbing, cyanosis,  or edema Lymphadenopathy: No cervical adenopathy noted. Neurological: Alert and oriented to person place and time. Skin: Skin is warm and dry. No rashes noted. Psychiatric: Normal mood and affect. Behavior is normal.  Labs and Imaging -- CBC    Component Value Date/Time   WBC 9.0 02/06/2012 1036   RBC 4.30 02/06/2012 1036   HGB 12.8 02/06/2012 1036   HCT 38.6 02/06/2012 1036   PLT 344 02/06/2012 1036   MCV 89.8 02/06/2012 1036   MCH 29.8 02/06/2012 1036   MCHC 33.2 02/06/2012  1036   RDW 12.9 02/06/2012 1036   LYMPHSABS 1.7 02/06/2012 1036   MONOABS 0.4 02/06/2012 1036   EOSABS 0.1 02/06/2012 1036   BASOSABS 0.0 02/06/2012 1036    CMP     Component Value Date/Time   NA 139 02/06/2012 1036   K 3.7 02/06/2012 1036   CL 106 02/06/2012 1036   CO2 24 02/06/2012 1036   GLUCOSE 110* 02/06/2012 1036   BUN 9 02/06/2012 1036   CREATININE 0.61 02/06/2012 1036   CALCIUM 8.8 02/06/2012 1036   PROT 6.9 02/06/2012 1036   ALBUMIN 3.2* 02/06/2012 1036   AST 12 02/06/2012 1036   ALT 21 02/06/2012 1036   ALKPHOS 69 02/06/2012 1036   BILITOT 0.2* 02/06/2012 1036   GFRNONAA >90 02/06/2012 1036   GFRAA >90 02/06/2012 1036    Lipase     Component Value Date/Time   LIPASE 15 02/06/2012 1036     ASSESSMENT AND PLAN: 37 year old female with a past medical history of kidney stones and gallstones status post cholecystectomy in 2000 who is seen in consultation at request of Dr. Margretta Ditty for evaluation of epigastric abdominal pain and heartburn.   1.  Dyspepsia/heartburn -- the patient's symptoms are consistent with ulcer-like dyspepsia. She has not had a food greatly from daily PPI, and for this reason I recommended direct visualization with upper endoscopy. We discussed the test today and she is agreeable to proceed. In the interim I would like to double her dose of pantoprazole to 40 mg twice daily, she is instructed to take this 30 minutes to one hour before her first and last meal of the day. Given her nonresponse to metoclopramide and ondansetron, these will be discontinued. I will give her prescription for promethazine 12.5 mg to be used as needed and as directed for nausea. I've also asked that she try to avoid Excedrin as the aspirin in this product can worsen gastritis or ulcer disease. Further recommendations after endoscopy.

## 2012-03-08 ENCOUNTER — Ambulatory Visit (AMBULATORY_SURGERY_CENTER): Payer: Self-pay | Admitting: Internal Medicine

## 2012-03-08 ENCOUNTER — Encounter: Payer: Self-pay | Admitting: Internal Medicine

## 2012-03-08 VITALS — BP 111/75 | HR 65 | Temp 98.0°F | Resp 24 | Ht 60.0 in | Wt 196.0 lb

## 2012-03-08 DIAGNOSIS — K3189 Other diseases of stomach and duodenum: Secondary | ICD-10-CM

## 2012-03-08 DIAGNOSIS — K319 Disease of stomach and duodenum, unspecified: Secondary | ICD-10-CM

## 2012-03-08 DIAGNOSIS — K2970 Gastritis, unspecified, without bleeding: Secondary | ICD-10-CM

## 2012-03-08 DIAGNOSIS — R1013 Epigastric pain: Secondary | ICD-10-CM

## 2012-03-08 DIAGNOSIS — K2990 Gastroduodenitis, unspecified, without bleeding: Secondary | ICD-10-CM

## 2012-03-08 MED ORDER — SUCRALFATE 1 G PO TABS: 1.0000 g | ORAL_TABLET | Freq: Three times a day (TID) | ORAL | Status: DC

## 2012-03-08 MED ORDER — SODIUM CHLORIDE 0.9 % IV SOLN: 500.0000 mL | INTRAVENOUS | Status: DC

## 2012-03-08 NOTE — Progress Notes (Signed)
I asked the pt how her nausea was...she said nausea was better.  Pt has not passing any flatus yet.  Encouraged her to pass flatus. Maw

## 2012-03-08 NOTE — Op Note (Signed)
[facility] [address] [place], [address]   ENDOSCOPY PROCEDURE REPORT  PATIENT: Melody, Taylor  MR#: [id number] BIRTHDATE: 1975-07-17 , 36  yrs. old GENDER: Female ENDOSCOPIST: Beverley Fiedler, MD REFERRED BY: PROCEDURE DATE:  03/08/2012 PROCEDURE:  EGD w/ biopsy for H.pylori ASA CLASS:     Class II INDICATIONS:  Heartburn.   Epigastric pain.   Dyspepsia. MEDICATIONS: MAC sedation, administered by CRNA and propofol (Diprivan) 250mg  IV TOPICAL ANESTHETIC: none  DESCRIPTION OF PROCEDURE: After the risks benefits and alternatives of the procedure were thoroughly explained, informed consent was obtained.  The LB GIF-H180 [id number] endoscope was introduced through the mouth and advanced to the second portion of the duodenum. Without limitations.  The instrument was slowly withdrawn as the mucosa was fully examined.     ESOPHAGUS: The mucosa of the esophagus appeared normal.   A normal Z-line was observed 35 cm from the incisors.  STOMACH: Erosive gastritis (inflammation) was found in the gastric antrum.  Biopsies were taken in the antrum and angularis.   The stomach otherwise appeared normal.  DUODENUM: Mild duodenal inflammation was found in the duodenal bulb. The duodenal mucosa showed no abnormalities in the 2nd part of the duodenum.  Retroflexed views revealed no abnormalities.     The scope was then withdrawn from the patient and the procedure completed.  COMPLICATIONS: There were no complications.  ENDOSCOPIC IMPRESSION: 1.   The mucosa of the esophagus appeared normal 2.   Normal Z-line was observed 35 cm from the incisors 3.   Erosive gastritis (inflammation) was found in the gastric antrum; biopsies were taken in the antrum and angularis 4.   The stomach otherwise appeared normal 5.   Duodenal inflammation was found in the duodenal bulb 6.   The duodenal mucosa showed no abnormalities in the 2nd part of the  duodenum    RECOMMENDATIONS: 1.  Await pathology results 2.  Follow-up of helicobacter pylori status, treat if indicated 3.  Continue pantoprazole 40 mg twice daily for another 4 weeks.  If improved, then can decrease to once daily.  Remember to taken this medication 30 minutes to 1 hour before your 1st and last meal of the day. 4.  Can add sucralfate 1 g three times daily before meals and at bedtime.   eSigned:  Beverley Fiedler, MD 03/08/2012 8:49 AM   CC: The Patient  PATIENT NAME:  Melody, Taylor MR#: [id number]

## 2012-03-08 NOTE — Patient Instructions (Addendum)
Per Dr. Rhea Belton continue pantpprazole 40 mg 2 x per day for 4 weeks.  If improved, then can decrease to 1 x daily.  Remember to take this medication 30 minutes to 1 hour before your first meal of the day.  Can add sucralfate 1 gm 3 x daily before meals and at bedtime.  This was sent to your pharmacy.  Continue your other current medications today.  Please call if any questions or concerns.      YOU HAD AN ENDOSCOPIC PROCEDURE TODAY AT THE [facility]: Refer to the procedure report that was given to you for any specific questions about what was found during the examination.  If the procedure report does not answer your questions, please call your gastroenterologist to clarify.  If you requested that your care partner not be given the details of your procedure findings, then the procedure report has been included in a sealed envelope for you to review at your convenience later.  YOU SHOULD EXPECT: Some feelings of bloating in the abdomen. Passage of more gas than usual.  Walking can help get rid of the air that was put into your GI tract during the procedure and reduce the bloating. If you had a lower endoscopy (such as a colonoscopy or flexible sigmoidoscopy) you may notice spotting of blood in your stool or on the toilet paper. If you underwent a bowel prep for your procedure, then you may not have a normal bowel movement for a few days.  DIET: Your first meal following the procedure should be a light meal and then it is ok to progress to your normal diet.  A half-sandwich or bowl of soup is an example of a good first meal.  Heavy or fried foods are harder to digest and may make you feel nauseous or bloated.  Likewise meals heavy in dairy and vegetables can cause extra gas to form and this can also increase the bloating.  Drink plenty of fluids but you should avoid alcoholic beverages for 24 hours.  ACTIVITY: Your care partner should take you home directly after the procedure.  You should plan  to take it easy, moving slowly for the rest of the day.  You can resume normal activity the day after the procedure however you should NOT DRIVE or use heavy machinery for 24 hours (because of the sedation medicines used during the test).    SYMPTOMS TO REPORT IMMEDIATELY: A gastroenterologist can be reached at any hour.  During normal business hours, 8:30 AM to 5:00 PM Monday through Friday, call [phone].  After hours and on weekends, please call the GI answering service at [phone] who will take a message and have the physician on call contact you.     Following upper endoscopy (EGD)  Vomiting of blood or coffee ground material  New chest pain or pain under the shoulder blades  Painful or persistently difficult swallowing  New shortness of breath  Fever of 100F or higher  Black, tarry-looking stools  FOLLOW UP: If any biopsies were taken you will be contacted by phone or by letter within the next 1-3 weeks.  Call your gastroenterologist if you have not heard about the biopsies in 3 weeks.  Our staff will call the home number listed on your records the next business day following your procedure to check on you and address any questions or concerns that you may have at that time regarding the information given to you following your procedure. This is a  courtesy call and so if there is no answer at the home number and we have not heard from you through the emergency physician on call, we will assume that you have returned to your regular daily activities without incident.  SIGNATURES/CONFIDENTIALITY: You and/or your care partner have signed paperwork which will be entered into your electronic medical record.  These signatures attest to the fact that that the information above on your After Visit Summary has been reviewed and is understood.  Full responsibility of the confidentiality of this discharge information lies with you and/or your care-partner.

## 2012-03-08 NOTE — Progress Notes (Signed)
The pt c/o "some nausea" when stimulated in the recovery room.  I explained gas was instilled in her stomach and encouraged her to pass the gas either by belching or from the rectum.  Pt stated she understood.  I offered a cool cloth to her neck or head and pt declined. Maw

## 2012-03-08 NOTE — Progress Notes (Signed)
Called to room to assist during endoscopic procedure.  Patient ID and intended procedure confirmed with present staff. Received instructions for my participation in the procedure from the performing physician.  

## 2012-03-08 NOTE — Progress Notes (Signed)
Patient did not experience any of the following events: a burn prior to discharge; a fall within the facility; wrong site/side/patient/procedure/implant event; or a hospital transfer or hospital admission upon discharge from the facility. (G8907) Patient did not have preoperative order for IV antibiotic SSI prophylaxis. (G8918)  

## 2012-03-08 NOTE — Progress Notes (Signed)
To PACU stable IV site ok

## 2012-03-09 ENCOUNTER — Telehealth: Payer: Self-pay | Admitting: *Deleted

## 2012-03-09 NOTE — Telephone Encounter (Signed)
  Follow up Call-  Call back number 03/08/2012  Post procedure Call Back phone  # [phone]  Permission to leave phone message Yes     Patient questions:  Do you have a fever, pain , or abdominal swelling? no Pain Score  0 *  Have you tolerated food without any problems? yes  Have you been able to return to your normal activities? yes  Do you have any questions about your discharge instructions: Diet   no Medications  no Follow up visit  no  Do you have questions or concerns about your Care? no  Actions: * If pain score is 4 or above: No action needed, pain <4.

## 2012-03-14 ENCOUNTER — Encounter: Payer: Self-pay | Admitting: Internal Medicine

## 2012-07-24 ENCOUNTER — Other Ambulatory Visit: Payer: Self-pay | Admitting: Obstetrics and Gynecology

## 2013-03-05 ENCOUNTER — Emergency Department (HOSPITAL_COMMUNITY)
Admission: EM | Admit: 2013-03-05 | Discharge: 2013-03-05 | Disposition: A | Discharge status: Home / Self Care | Payer: Self-pay | Attending: Emergency Medicine | Admitting: Emergency Medicine

## 2013-03-05 ENCOUNTER — Encounter (HOSPITAL_COMMUNITY): Payer: Self-pay | Admitting: Emergency Medicine

## 2013-03-05 DIAGNOSIS — Z8719 Personal history of other diseases of the digestive system: Secondary | ICD-10-CM | POA: Insufficient documentation

## 2013-03-05 DIAGNOSIS — Z87891 Personal history of nicotine dependence: Secondary | ICD-10-CM | POA: Insufficient documentation

## 2013-03-05 DIAGNOSIS — R51 Headache: Secondary | ICD-10-CM | POA: Insufficient documentation

## 2013-03-05 DIAGNOSIS — Z87442 Personal history of urinary calculi: Secondary | ICD-10-CM | POA: Insufficient documentation

## 2013-03-05 DIAGNOSIS — R519 Headache, unspecified: Secondary | ICD-10-CM

## 2013-03-05 MED ORDER — DIPHENHYDRAMINE HCL 50 MG/ML IJ SOLN
25.0000 mg | Freq: Once | INTRAMUSCULAR | Status: AC
  Administered 2013-03-05: 25 mg via INTRAVENOUS
  Filled 2013-03-05: qty 1

## 2013-03-05 MED ORDER — KETOROLAC TROMETHAMINE 30 MG/ML IJ SOLN
30.0000 mg | Freq: Once | INTRAMUSCULAR | Status: AC
  Administered 2013-03-05: 30 mg via INTRAVENOUS
  Filled 2013-03-05: qty 1

## 2013-03-05 MED ORDER — METOCLOPRAMIDE HCL 5 MG/ML IJ SOLN
10.0000 mg | Freq: Once | INTRAMUSCULAR | Status: AC
  Administered 2013-03-05: 10 mg via INTRAVENOUS
  Filled 2013-03-05: qty 2

## 2013-03-05 MED ORDER — SODIUM CHLORIDE 0.9 % IV BOLUS (SEPSIS)
1000.0000 mL | Freq: Once | INTRAVENOUS | Status: AC
  Administered 2013-03-05: 1000 mL via INTRAVENOUS

## 2013-03-05 NOTE — Discharge Instructions (Signed)
Return to the ED with worsening or concerning symptoms.  °

## 2013-03-05 NOTE — ED Provider Notes (Signed)
CSN: [id number]     Arrival date & time 03/05/13  1106 History   First MD Initiated Contact with Patient 03/05/13 1124     Chief Complaint  Patient presents with  . Headache   (Consider location/radiation/quality/duration/timing/severity/associated sxs/prior Treatment) HPI Comments: Patient is a 38 year old female with a past medical history of chronic migraines who presents with a headache for 4 days. Patient reports a gradual onset and progressive worsening of the headache. The pain is sharp, constant and is located in generalized head without radiation. Patient has tried OTC medications for symptoms without relief. No alleviating/aggravating factors. Patient reports associated nausea and photophobia. Patient denies fever, vomiting, diarrhea, numbness/tingling, weakness, visual changes, congestion, chest pain, SOB, abdominal pain.     Patient is a 38 y.o. female presenting with headaches.  Headache Associated symptoms: nausea   Associated symptoms: no abdominal pain, no diarrhea, no dizziness, no fatigue, no fever, no neck pain and no vomiting     Past Medical History  Diagnosis Date  . Kidney stones   . Gallstones    Past Surgical History  Procedure Laterality Date  . Cholecystectomy    . Tubal ligation    . Dilation and curettage of uterus    . Ablation    . Lithotripsy     Family History  Problem Relation Age of Onset  . Diabetes Maternal Aunt   . Heart disease Maternal Grandfather   . Heart disease Maternal Uncle     x 2  . Irritable bowel syndrome Mother   . Cancer Maternal Aunt     kidney   History  Substance Use Topics  . Smoking status: Former Smoker    Quit date: 12/09/2008  . Smokeless tobacco: Never Used  . Alcohol Use: No   OB History   Grav Para Term Preterm Abortions TAB SAB Ect Mult Living                 Review of Systems  Constitutional: Negative for fever, chills and fatigue.  HENT: Negative for trouble swallowing.   Eyes: Negative for visual  disturbance.  Respiratory: Negative for shortness of breath.   Cardiovascular: Negative for chest pain and palpitations.  Gastrointestinal: Positive for nausea. Negative for vomiting, abdominal pain and diarrhea.  Genitourinary: Negative for dysuria and difficulty urinating.  Musculoskeletal: Negative for arthralgias and neck pain.  Skin: Negative for color change.  Neurological: Positive for headaches. Negative for dizziness and weakness.  Psychiatric/Behavioral: Negative for dysphoric mood.    Allergies  Review of patient's allergies indicates no known allergies.  Home Medications   Current Outpatient Rx  Name  Route  Sig  Dispense  Refill  . FLUoxetine (PROZAC) 20 MG capsule   Oral   Take 20 mg by mouth daily as needed (for anxiety).          BP 110/70  Pulse 81  Temp(Src) 97.6 F (36.4 C) (Oral)  Resp 18  Wt 211 lb (95.709 kg)  SpO2 99%  LMP 02/08/2013 Physical Exam  Nursing note and vitals reviewed. Constitutional: She is oriented to person, place, and time. She appears well-developed and well-nourished. No distress.  HENT:  Head: Normocephalic and atraumatic.  Eyes: Conjunctivae and EOM are normal.  Neck: Normal range of motion. Neck supple.  Cardiovascular: Normal rate and regular rhythm.  Exam reveals no gallop and no friction rub.   No murmur heard. Pulmonary/Chest: Effort normal and breath sounds normal. She has no wheezes. She has no rales. She exhibits  no tenderness.  Abdominal: Soft. She exhibits no distension. There is no tenderness. There is no rebound and no guarding.  Musculoskeletal: Normal range of motion.  Neurological: She is alert and oriented to person, place, and time. Coordination normal.  No meningeal signs. Speech is goal-oriented. Moves limbs without ataxia.   Skin: Skin is warm and dry.  Psychiatric: She has a normal mood and affect. Her behavior is normal.    ED Course  Procedures (including critical care time) Labs Review Labs  Reviewed - No data to display Imaging Review No results found.  EKG Interpretation   None       MDM   1. Headache     12:48 PM Patient feeling much better after migraine cocktail of fluids, toradol, reglan and benadryl. Patient tachycardic with remaining vitals stable. No meningeal signs. Patient will be discharged with recommended follow up with PCP. Patient advised to return with worsening or concerning symptoms.     Emilia BeckKaitlyn Adalie Mand, PA-C 03/05/13 1517

## 2013-03-05 NOTE — ED Notes (Signed)
Pt reports HA x 4 days. Hx of the same, but states this is lasting longer then normal. Pt AO x4. Neuro intact. Family at bedside.

## 2013-03-05 NOTE — ED Provider Notes (Signed)
Medical screening examination/treatment/procedure(s) were performed by non-physician practitioner and as supervising physician I was immediately available for consultation/collaboration.  Maral Lampe L Jurnei Latini, MD 03/05/13 1737 

## 2013-03-05 NOTE — ED Notes (Signed)
Pt is here with the 4th day of headache pain all over and reports neck pain, no fever.  Pt has been seen at urgent care.  PT states the pain got so bad that she could have vomited.

## 2013-07-31 ENCOUNTER — Other Ambulatory Visit: Payer: Self-pay | Admitting: Obstetrics and Gynecology

## 2013-08-01 LAB — CYTOLOGY - PAP

## 2013-08-22 ENCOUNTER — Other Ambulatory Visit: Payer: Self-pay | Admitting: Sports Medicine

## 2013-08-22 DIAGNOSIS — M5126 Other intervertebral disc displacement, lumbar region: Secondary | ICD-10-CM

## 2013-09-02 ENCOUNTER — Ambulatory Visit
Admission: RE | Admit: 2013-09-02 | Discharge: 2013-09-02 | Disposition: A | Discharge status: Home / Self Care | Payer: No Typology Code available for payment source | Source: Ambulatory Visit | Attending: Sports Medicine | Admitting: Sports Medicine

## 2013-09-02 VITALS — BP 114/63 | HR 76

## 2013-09-02 DIAGNOSIS — M5126 Other intervertebral disc displacement, lumbar region: Secondary | ICD-10-CM

## 2013-09-02 MED ORDER — IOHEXOL 180 MG/ML  SOLN: 1.0000 mL | Freq: Once | INTRAMUSCULAR | Status: AC | PRN

## 2013-09-02 MED ORDER — METHYLPREDNISOLONE ACETATE 40 MG/ML INJ SUSP (RADIOLOG: 120.0000 mg | Freq: Once | INTRAMUSCULAR | Status: DC

## 2013-09-02 NOTE — Discharge Instructions (Signed)

## 2013-09-16 ENCOUNTER — Other Ambulatory Visit: Payer: Self-pay | Admitting: Neurological Surgery

## 2013-09-16 DIAGNOSIS — M5127 Other intervertebral disc displacement, lumbosacral region: Secondary | ICD-10-CM

## 2013-09-18 ENCOUNTER — Other Ambulatory Visit: Payer: Self-pay

## 2013-12-12 IMAGING — CR DG HAND COMPLETE 3+V*L*
3 series · 3 of 3 positions shown · non-contrast
Comparison: None.

CLINICAL DATA: Fall, left hand pain/injury

LEFT HAND - COMPLETE 3+ VIEW

[x hand pa left]
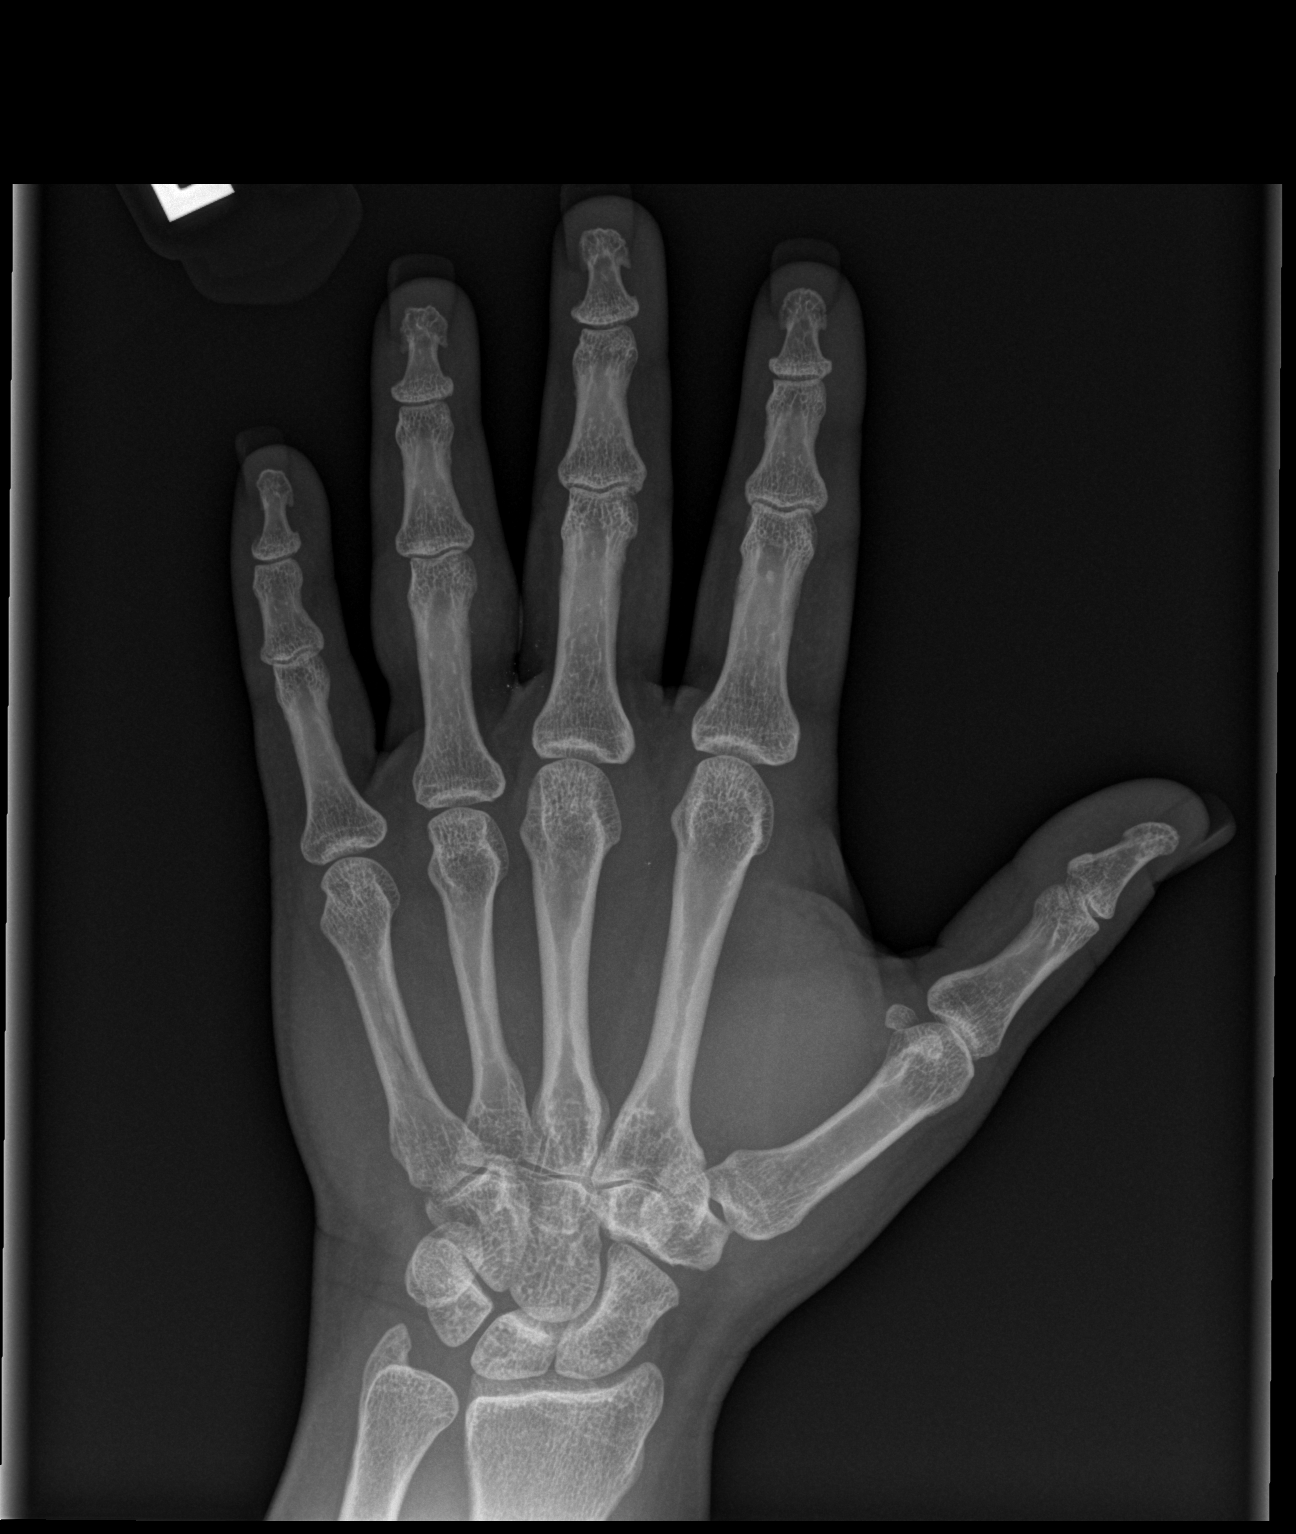

[x hand obl left]
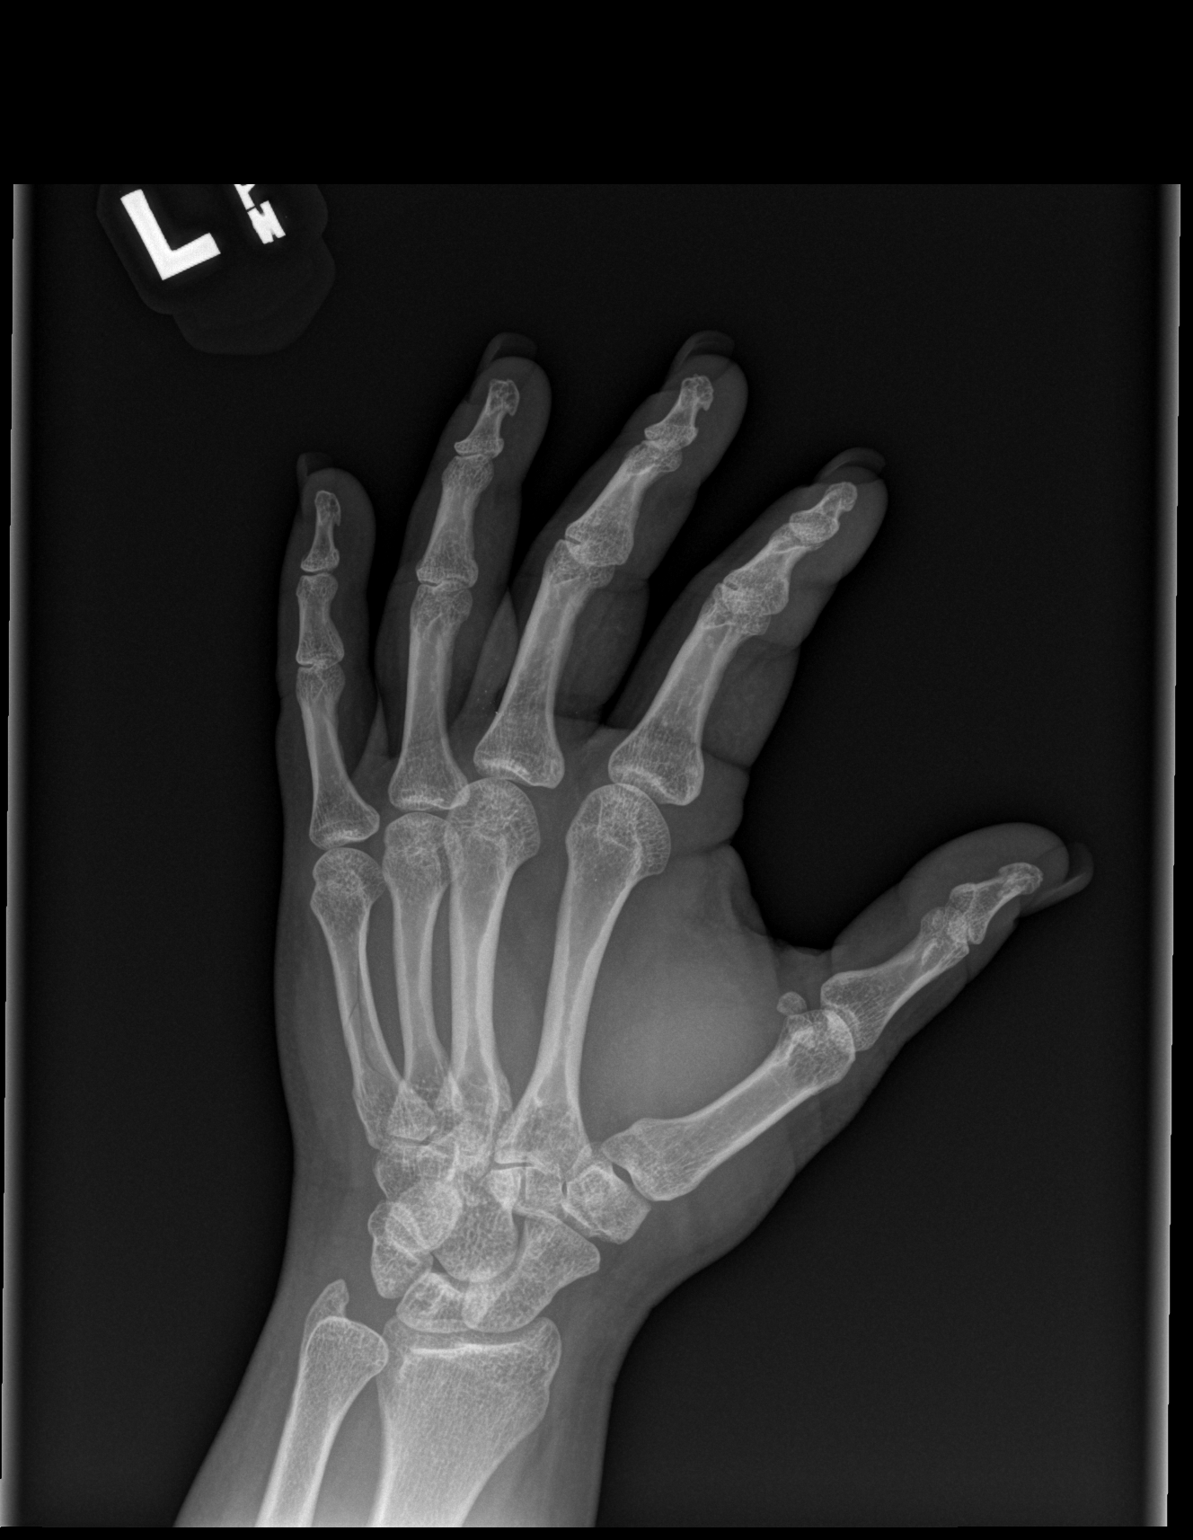

[x hand lat left]
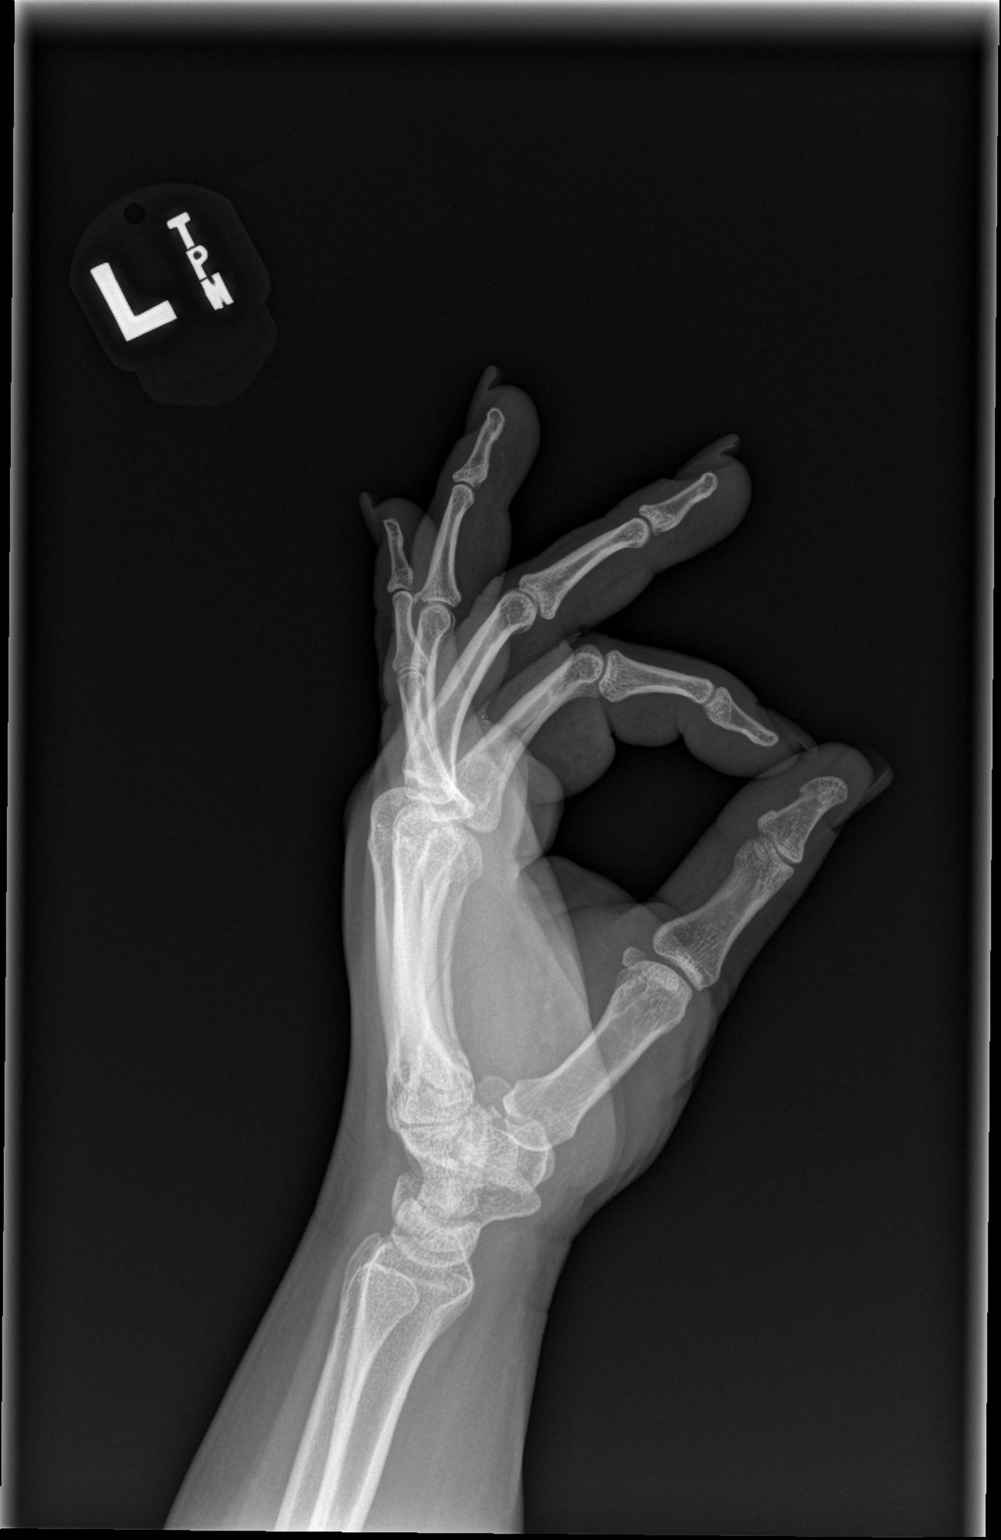

[3 of 3 positions shown; findings below may reference images not displayed]

FINDINGS: Mildly comminuted, nondisplaced fracture involving the
fifth metacarpal shaft.

No additional fracture is seen.

The joint spaces are preserved.

Visualized soft tissues are grossly unremarkable.
IMPRESSION: Mildly comminuted, nondisplaced fracture involving the fifth
metacarpal shaft.

## 2014-05-02 ENCOUNTER — Other Ambulatory Visit (INDEPENDENT_AMBULATORY_CARE_PROVIDER_SITE_OTHER): Payer: Self-pay

## 2014-05-02 ENCOUNTER — Ambulatory Visit (INDEPENDENT_AMBULATORY_CARE_PROVIDER_SITE_OTHER): Payer: Self-pay | Admitting: Internal Medicine

## 2014-05-02 ENCOUNTER — Encounter: Payer: Self-pay | Admitting: Internal Medicine

## 2014-05-02 VITALS — BP 128/90 | HR 104 | Temp 98.1°F | Resp 16 | Ht 60.0 in | Wt 218.0 lb

## 2014-05-02 DIAGNOSIS — J309 Allergic rhinitis, unspecified: Secondary | ICD-10-CM | POA: Insufficient documentation

## 2014-05-02 DIAGNOSIS — K219 Gastro-esophageal reflux disease without esophagitis: Secondary | ICD-10-CM

## 2014-05-02 DIAGNOSIS — J302 Other seasonal allergic rhinitis: Secondary | ICD-10-CM

## 2014-05-02 DIAGNOSIS — Z0000 Encounter for general adult medical examination without abnormal findings: Secondary | ICD-10-CM

## 2014-05-02 LAB — BASIC METABOLIC PANEL
BUN: 11 mg/dL (ref 6–23)
CALCIUM: 9.3 mg/dL (ref 8.4–10.5)
CO2: 31 meq/L (ref 19–32)
CREATININE: 0.63 mg/dL (ref 0.40–1.20)
Chloride: 104 mEq/L (ref 96–112)
GFR: 111.58 mL/min (ref >60.00)
Glucose, Bld: 99 mg/dL (ref 70–99)
Potassium: 3.9 mEq/L (ref 3.5–5.1)
Sodium: 138 mEq/L (ref 135–145)

## 2014-05-02 LAB — LIPID PANEL
Cholesterol: 178 mg/dL (ref 0–200)
HDL: 50.4 mg/dL (ref >39.00)
LDL Cholesterol: 98 mg/dL (ref 0–99)
NONHDL: 127.6
Total CHOL/HDL Ratio: 4
Triglycerides: 146 mg/dL (ref 0.0–149.0)
VLDL: 29.2 mg/dL (ref 0.0–40.0)

## 2014-05-02 LAB — HEMOGLOBIN A1C: Hgb A1c MFr Bld: 5.7 % (ref 4.6–6.5)

## 2014-05-02 NOTE — Patient Instructions (Signed)
We are going to check some blood work for the cholesterol, signs of diabetes, and on your kidneys.   We recommend starting off walking for 10 minutes at a time until you can walk for 30 minutes without stopping. The goal for exercise while trying to lose weight is 45 minutes a day for 6 days a week.   We will see you back in about 6-12 months to check in on you. We will call you back about the blood work even if it is normal.   Serving Sizes What we call a serving size today is larger than it was in the past. A 1950s fast-food burger contained little more than 1 oz of meat, and a soft drink was 8 oz (1 cup). Today, a "quarter pounder" burger is at least 4 times that amount, and a 32 or 64 oz drink is not uncommon. A possible guide for eating when trying to lose weight is to eat about half as much as you normally do. Some estimates of serving sizes are:  1 Dairy serving:Individual container of yogurt (8 oz) or piece of cheese the size of your thumb (1 oz).  1 Grain serving: 1 slice of bread or  cup pasta.  1 Meat serving: The size of a deck of cards (3 oz).  1 Fruit serving: cup canned fruit or 1 medium fruit.  1 Vegetable serving:  cup of cooked or canned vegetables.  1 Fat serving:The size of 4 stacked dimes. Experts suggest spending 1 or 2 days measuring food portions you commonly eat. This will give you better practice at estimating serving sizes, and will also show whether you are eating an appropriate amount of food to meet your weight goals. If you find that you are eating more than you thought, try measuring your food for a few days so you can "reprogram" yourself to learn what makes a healthy portion for you. SUGGESTIONS FOR CONTROL  In restaurants, share entrees, or ask the waiter to put half the entre in a box or bag before you even touch it.  Order lunch-sized portions. Many restaurants serve 4 to 6 oz of meat at lunch, compared with 8 to 10 oz at dinner.  Split dessert or  skip it all together. Have a piece of fruit when you get home.  At home, use smaller plates and bowls. It will look as if you are eating more.  Plate your food in the kitchen rather than serving it "family style" at the table.  Wait 20 to 30 minutes before taking seconds. This is how long it takes your brain to recognize that you are full.  Check food labels for serving sizes. Eat 1 serving only.  Use measuring cups and spoons to see proper serving sizes.  Buy smaller packages of candy, popcorn, and snacks.  Avoid eating directly out of the bag or carton.  While eating half as much, exercise twice as much. Park further away from the mall, take the stairs instead of the escalator, and walk around your block. Losing weight is a slow, difficult process. It takes long-lasting lifestyle changes. You can make gradual changes over time so they become habits. Look to friends and family to support the healthy changes you are making. Avoid fad diets since they are often only temporary weight loss solutions. Document Released: 11/13/2002 Document Revised: 05/09/2011 Document Reviewed: 05/14/2013 [facility] Patient Information 2015 [place], [place]. This information is not intended to replace advice given to you by your health care provider.  Make sure you discuss any questions you have with your health care provider. Calorie Counting for Weight Loss Calories are energy you get from the things you eat and drink. Your body uses this energy to keep you going throughout the day. The number of calories you eat affects your weight. When you eat more calories than your body needs, your body stores the extra calories as fat. When you eat fewer calories than your body needs, your body burns fat to get the energy it needs. Calorie counting means keeping track of how many calories you eat and drink each day. If you make sure to eat fewer calories than your body needs, you should lose weight. In order for calorie  counting to work, you will need to eat the number of calories that are right for you in a day to lose a healthy amount of weight per week. A healthy amount of weight to lose per week is usually 1-2 lb (0.5-0.9 kg). A dietitian can determine how many calories you need in a day and give you suggestions on how to reach your calorie goal.  WHAT IS MY MY PLAN? My goal is to have __________ calories per day.  If I have this many calories per day, I should lose around __________ pounds per week. WHAT DO I NEED TO KNOW ABOUT CALORIE COUNTING? In order to meet your daily calorie goal, you will need to:  Find out how many calories are in each food you would like to eat. Try to do this before you eat.  Decide how much of the food you can eat.  Write down what you ate and how many calories it had. Doing this is called keeping a food log. WHERE DO I FIND CALORIE INFORMATION? The number of calories in a food can be found on a Nutrition Facts label. Note that all the information on a label is based on a specific serving of the food. If a food does not have a Nutrition Facts label, try to look up the calories online or ask your dietitian for help. HOW DO I DECIDE HOW MUCH TO EAT? To decide how much of the food you can eat, you will need to consider both the number of calories in one serving and the size of one serving. This information can be found on the Nutrition Facts label. If a food does not have a Nutrition Facts label, look up the information online or ask your dietitian for help. Remember that calories are listed per serving. If you choose to have more than one serving of a food, you will have to multiply the calories per serving by the amount of servings you plan to eat. For example, the label on a package of bread might say that a serving size is 1 slice and that there are 90 calories in a serving. If you eat 1 slice, you will have eaten 90 calories. If you eat 2 slices, you will have eaten 180  calories. HOW DO I KEEP A FOOD LOG? After each meal, record the following information in your food log:  What you ate.  How much of it you ate.  How many calories it had.  Then, add up your calories. Keep your food log near you, such as in a small notebook in your pocket. Another option is to use a mobile app or website. Some programs will calculate calories for you and show you how many calories you have left each time you add an item to  the log. WHAT ARE SOME CALORIE COUNTING TIPS?  Use your calories on foods and drinks that will fill you up and not leave you hungry. Some examples of this include foods like nuts and nut butters, vegetables, lean proteins, and high-fiber foods (more than 5 g fiber per serving).  Eat nutritious foods and avoid empty calories. Empty calories are calories you get from foods or beverages that do not have many nutrients, such as candy and soda. It is better to have a nutritious high-calorie food (such as an avocado) than a food with few nutrients (such as a bag of chips).  Know how many calories are in the foods you eat most often. This way, you do not have to look up how many calories they have each time you eat them.  Look out for foods that may seem like low-calorie foods but are really high-calorie foods, such as baked goods, soda, and fat-free candy.  Pay attention to calories in drinks. Drinks such as sodas, specialty coffee drinks, alcohol, and juices have a lot of calories yet do not fill you up. Choose low-calorie drinks like water and diet drinks.  Focus your calorie counting efforts on higher calorie items. Logging the calories in a garden salad that contains only vegetables is less important than calculating the calories in a milk shake.  Find a way of tracking calories that works for you. Get creative. Most people who are successful find ways to keep track of how much they eat in a day, even if they do not count every calorie. WHAT ARE SOME  PORTION CONTROL TIPS?  Know how many calories are in a serving. This will help you know how many servings of a certain food you can have.  Use a measuring cup to measure serving sizes. This is helpful when you start out. With time, you will be able to estimate serving sizes for some foods.  Take some time to put servings of different foods on your favorite plates, bowls, and cups so you know what a serving looks like.  Try not to eat straight from a bag or box. Doing this can lead to overeating. Put the amount you would like to eat in a cup or on a plate to make sure you are eating the right portion.  Use smaller plates, glasses, and bowls to prevent overeating. This is a quick and easy way to practice portion control. If your plate is smaller, less food can fit on it.  Try not to multitask while eating, such as watching TV or using your computer. If it is time to eat, sit down at a table and enjoy your food. Doing this will help you to start recognizing when you are full. It will also make you more aware of what and how much you are eating. HOW CAN I CALORIE COUNT WHEN EATING OUT?  Ask for smaller portion sizes or child-sized portions.  Consider sharing an entree and sides instead of getting your own entree.  If you get your own entree, eat only half. Ask for a box at the beginning of your meal and put the rest of your entree in it so you are not tempted to eat it.  Look for the calories on the menu. If calories are listed, choose the lower calorie options.  Choose dishes that include vegetables, fruits, whole grains, low-fat dairy products, and lean protein. Focusing on smart food choices from each of the 5 food groups can help you stay on track at  restaurants.  Choose items that are boiled, broiled, grilled, or steamed.  Choose water, milk, unsweetened iced tea, or other drinks without added sugars. If you want an alcoholic beverage, choose a lower calorie option. For example, a regular  margarita can have up to 700 calories and a glass of wine has around 150.  Stay away from items that are buttered, battered, fried, or served with cream sauce. Items labeled "crispy" are usually fried, unless stated otherwise.  Ask for dressings, sauces, and syrups on the side. These are usually very high in calories, so do not eat much of them.  Watch out for salads. Many people think salads are a healthy option, but this is often not the case. Many salads come with bacon, fried chicken, lots of cheese, fried chips, and dressing. All of these items have a lot of calories. If you want a salad, choose a garden salad and ask for grilled meats or steak. Ask for the dressing on the side, or ask for olive oil and vinegar or lemon to use as dressing.  Estimate how many servings of a food you are given. For example, a serving of cooked rice is  cup or about the size of half a tennis ball or one cupcake wrapper. Knowing serving sizes will help you be aware of how much food you are eating at restaurants. The list below tells you how big or small some common portion sizes are based on everyday objects.  1 oz--4 stacked dice.  3 oz--1 deck of cards.  1 tsp--1 dice.  1 Tbsp-- a Ping-Pong ball.  2 Tbsp--1 Ping-Pong ball.   cup--1 tennis ball or 1 cupcake wrapper.  1 cup--1 baseball. Document Released: 02/14/2005 Document Revised: 07/01/2013 Document Reviewed: 12/20/2012 [facility] Patient Information 2015 [place], [place]. This information is not intended to replace advice given to you by your health care provider. Make sure you discuss any questions you have with your health care provider.

## 2014-05-02 NOTE — Assessment & Plan Note (Signed)
Colonoscopy due at 49, had flu shot this year already. Does not want tetanus today. Pap smear up to date. Talked about diet and exercise. Talked about smoking and reminded her to not resume smoking.

## 2014-05-02 NOTE — Assessment & Plan Note (Signed)
Talked with her about diet and exercise. Advised water aerobics or walking (they already belong to [facility]) 45-60 minutes a day for 6 days per week for weight loss. Given information about calorie counting and serving size. She will return in about 6-12 months to check in.

## 2014-05-02 NOTE — Assessment & Plan Note (Signed)
Takes claritin year round for her allergies. Well controlled.

## 2014-05-02 NOTE — Progress Notes (Signed)
   Subjective:    Patient ID: Melody Taylor, female    DOB: 1975-05-20, 39 y.o.   MRN: [id number]  HPI The patient is a 39 YO female who is coming in for wellness. She does not have significant PMH. Her only concern is her weight. She wants information about weight loss and exercise. Denies new complaints.   PMH, [facility], social history reviewed and updated today.   Review of Systems  Constitutional: Negative for fever, activity change, appetite change, fatigue and unexpected weight change.  HENT: Negative.   Eyes: Negative.   Respiratory: Negative for cough, chest tightness, shortness of breath and wheezing.   Cardiovascular: Negative for chest pain, palpitations and leg swelling.  Gastrointestinal: Negative for abdominal pain, diarrhea, constipation and abdominal distention.  Musculoskeletal: Negative.   Skin: Negative.   Neurological: Negative.   Psychiatric/Behavioral: Negative.       Objective:   Physical Exam  Constitutional: She is oriented to person, place, and time. She appears well-developed and well-nourished.  Overweight  HENT:  Head: Normocephalic and atraumatic.  Eyes: EOM are normal.  Neck: Normal range of motion.  Cardiovascular: Normal rate and regular rhythm.   Carotids without bruits.  Pulmonary/Chest: Effort normal and breath sounds normal. No respiratory distress. She has no wheezes. She has no rales.  Abdominal: Soft. Bowel sounds are normal.  Musculoskeletal: She exhibits no edema.  Neurological: She is alert and oriented to person, place, and time. Coordination normal.  Skin: Skin is warm and dry.  Psychiatric: She has a normal mood and affect.   Filed Vitals:   05/02/14 1509  BP: 128/90  Pulse: 104  Temp: 98.1 F (36.7 C)  TempSrc: Oral  Resp: 16  Height: 5' (1.524 m)  Weight: 218 lb (98.884 kg)  SpO2: 97%      Assessment & Plan:

## 2014-05-02 NOTE — Progress Notes (Signed)
Pre visit review using our clinic review tool, if applicable. No additional management support is needed unless otherwise documented below in the visit note. 

## 2014-05-02 NOTE — Assessment & Plan Note (Signed)
Takes protonix rarely when she has flares. Talked about how weight loss would likely help with this.

## 2014-05-26 ENCOUNTER — Telehealth: Payer: Self-pay | Admitting: Neurology

## 2014-05-26 ENCOUNTER — Ambulatory Visit: Payer: Self-pay | Admitting: Neurology

## 2014-05-26 NOTE — Telephone Encounter (Signed)
This patient canceled her new patient appointment within 90 minutes of the appointment time.

## 2014-05-27 ENCOUNTER — Encounter: Payer: Self-pay | Admitting: Neurology

## 2014-05-29 ENCOUNTER — Telehealth: Payer: Self-pay

## 2014-05-29 ENCOUNTER — Encounter: Payer: Self-pay | Admitting: Neurology

## 2014-05-29 ENCOUNTER — Telehealth: Payer: Self-pay | Admitting: Neurology

## 2014-05-29 ENCOUNTER — Ambulatory Visit: Payer: Self-pay | Admitting: Neurology

## 2014-05-29 NOTE — Telephone Encounter (Signed)
Patient did not show up for a new patient appointment twice.

## 2014-05-29 NOTE — Telephone Encounter (Signed)
This patient did not show for a new patient appointment today. 

## 2014-05-29 NOTE — Telephone Encounter (Signed)
Patient did not come to a new patient appointment today. 

## 2014-05-30 ENCOUNTER — Encounter: Payer: Self-pay | Admitting: Neurology

## 2014-09-25 ENCOUNTER — Other Ambulatory Visit: Payer: Self-pay | Admitting: Obstetrics and Gynecology

## 2014-09-25 DIAGNOSIS — N6001 Solitary cyst of right breast: Secondary | ICD-10-CM

## 2014-09-29 ENCOUNTER — Other Ambulatory Visit: Payer: Self-pay | Admitting: Obstetrics and Gynecology

## 2014-09-29 ENCOUNTER — Ambulatory Visit
Admission: RE | Admit: 2014-09-29 | Discharge: 2014-09-29 | Disposition: A | Discharge status: Home / Self Care | Payer: No Typology Code available for payment source | Source: Ambulatory Visit | Attending: Obstetrics and Gynecology | Admitting: Obstetrics and Gynecology

## 2014-09-29 DIAGNOSIS — N6001 Solitary cyst of right breast: Secondary | ICD-10-CM

## 2014-09-29 DIAGNOSIS — N6320 Unspecified lump in the left breast, unspecified quadrant: Secondary | ICD-10-CM

## 2015-01-29 ENCOUNTER — Encounter (HOSPITAL_COMMUNITY): Payer: Self-pay | Admitting: Neurology

## 2015-01-29 ENCOUNTER — Emergency Department (HOSPITAL_COMMUNITY): Payer: Self-pay

## 2015-01-29 ENCOUNTER — Telehealth: Payer: Self-pay | Admitting: Internal Medicine

## 2015-01-29 ENCOUNTER — Emergency Department (HOSPITAL_COMMUNITY)
Admission: EM | Admit: 2015-01-29 | Discharge: 2015-01-29 | Disposition: A | Discharge status: Home / Self Care | Payer: Self-pay | Attending: Emergency Medicine | Admitting: Emergency Medicine

## 2015-01-29 DIAGNOSIS — Z9889 Other specified postprocedural states: Secondary | ICD-10-CM | POA: Insufficient documentation

## 2015-01-29 DIAGNOSIS — Z9049 Acquired absence of other specified parts of digestive tract: Secondary | ICD-10-CM | POA: Insufficient documentation

## 2015-01-29 DIAGNOSIS — Z87891 Personal history of nicotine dependence: Secondary | ICD-10-CM | POA: Insufficient documentation

## 2015-01-29 DIAGNOSIS — Z9851 Tubal ligation status: Secondary | ICD-10-CM | POA: Insufficient documentation

## 2015-01-29 DIAGNOSIS — Z8719 Personal history of other diseases of the digestive system: Secondary | ICD-10-CM | POA: Insufficient documentation

## 2015-01-29 DIAGNOSIS — N200 Calculus of kidney: Secondary | ICD-10-CM | POA: Insufficient documentation

## 2015-01-29 DIAGNOSIS — R109 Unspecified abdominal pain: Secondary | ICD-10-CM

## 2015-01-29 DIAGNOSIS — Z79899 Other long term (current) drug therapy: Secondary | ICD-10-CM | POA: Insufficient documentation

## 2015-01-29 LAB — COMPREHENSIVE METABOLIC PANEL
ALBUMIN: 3.8 g/dL (ref 3.5–5.0)
ALT: 24 U/L (ref 14–54)
ANION GAP: 8 (ref 5–15)
AST: 19 U/L (ref 15–41)
Alkaline Phosphatase: 74 U/L (ref 38–126)
BUN: 13 mg/dL (ref 6–20)
CO2: 23 mmol/L (ref 22–32)
CREATININE: 0.7 mg/dL (ref 0.44–1.00)
Calcium: 8.9 mg/dL (ref 8.9–10.3)
Chloride: 107 mmol/L (ref 101–111)
GFR calc Af Amer: >60 mL/min (ref >60)
GFR calc non Af Amer: >60 mL/min (ref >60)
GLUCOSE: 108 mg/dL — AB (ref 65–99)
Potassium: 3.7 mmol/L (ref 3.5–5.1)
SODIUM: 138 mmol/L (ref 135–145)
Total Bilirubin: 0.4 mg/dL (ref 0.3–1.2)
Total Protein: 6.7 g/dL (ref 6.5–8.1)

## 2015-01-29 LAB — CBC WITH DIFFERENTIAL/PLATELET
BASOS PCT: 0 %
Basophils Absolute: 0 10*3/uL (ref 0.0–0.1)
EOS ABS: 0.2 10*3/uL (ref 0.0–0.7)
Eosinophils Relative: 2 %
HCT: 41.4 % (ref 36.0–46.0)
Hemoglobin: 13.3 g/dL (ref 12.0–15.0)
Lymphocytes Relative: 25 %
Lymphs Abs: 2.4 10*3/uL (ref 0.7–4.0)
MCH: 29.2 pg (ref 26.0–34.0)
MCHC: 32.1 g/dL (ref 30.0–36.0)
MCV: 91 fL (ref 78.0–100.0)
Monocytes Absolute: 0.5 10*3/uL (ref 0.1–1.0)
Monocytes Relative: 6 %
NEUTROS PCT: 67 %
Neutro Abs: 6.4 10*3/uL (ref 1.7–7.7)
Platelets: 322 10*3/uL (ref 150–400)
RBC: 4.55 MIL/uL (ref 3.87–5.11)
RDW: 13.2 % (ref 11.5–15.5)
WBC: 9.6 10*3/uL (ref 4.0–10.5)

## 2015-01-29 LAB — URINALYSIS, ROUTINE W REFLEX MICROSCOPIC
Glucose, UA: NEGATIVE mg/dL
KETONES UR: 15 mg/dL — AB
NITRITE: NEGATIVE
PH: 5.5 (ref 5.0–8.0)
PROTEIN: 100 mg/dL — AB
SPECIFIC GRAVITY, URINE: 1.028 (ref 1.005–1.030)

## 2015-01-29 LAB — URINE MICROSCOPIC-ADD ON

## 2015-01-29 LAB — I-STAT BETA HCG BLOOD, ED (MC, WL, AP ONLY): I-stat hCG, quantitative: <5 m[IU]/mL (ref <5)

## 2015-01-29 MED ORDER — ONDANSETRON 4 MG PO TBDP
4.0000 mg | ORAL_TABLET | Freq: Once | ORAL | Status: AC | PRN
  Administered 2015-01-29: 4 mg via ORAL

## 2015-01-29 MED ORDER — ONDANSETRON 4 MG PO TBDP
ORAL_TABLET | ORAL | Status: AC
  Filled 2015-01-29: qty 1

## 2015-01-29 MED ORDER — ONDANSETRON HCL 4 MG/2ML IJ SOLN
4.0000 mg | Freq: Once | INTRAMUSCULAR | Status: AC
  Administered 2015-01-29: 4 mg via INTRAVENOUS
  Filled 2015-01-29: qty 2

## 2015-01-29 MED ORDER — HYDROMORPHONE HCL 1 MG/ML IJ SOLN
1.0000 mg | Freq: Once | INTRAMUSCULAR | Status: AC
  Administered 2015-01-29: 1 mg via INTRAVENOUS
  Filled 2015-01-29: qty 1

## 2015-01-29 MED ORDER — OXYCODONE-ACETAMINOPHEN 5-325 MG PO TABS: 1.0000 | ORAL_TABLET | ORAL | Status: DC | PRN

## 2015-01-29 MED ORDER — FENTANYL CITRATE (PF) 100 MCG/2ML IJ SOLN
50.0000 ug | Freq: Once | INTRAMUSCULAR | Status: AC
  Administered 2015-01-29: 50 ug via NASAL

## 2015-01-29 MED ORDER — FENTANYL CITRATE (PF) 100 MCG/2ML IJ SOLN
INTRAMUSCULAR | Status: AC
  Filled 2015-01-29: qty 2

## 2015-01-29 NOTE — ED Notes (Signed)
Patient left at this time with all belongings. 

## 2015-01-29 NOTE — Telephone Encounter (Signed)
[facility] - Client TELEPHONE ADVICE RECORD [facility] Patient Name: Melody SmartMBER Mariner DOB: December 18, 1975 Initial Comment Caller states she started having a pain in her back 2 hours ago and now she is urinating straight blood. Nurse Assessment Nurse: Ladona RidgelGaddy, RN, Felicia Date/Time (Eastern Time): 01/29/2015 3:21:27 PM Confirm and document reason for call. If symptomatic, describe symptoms. ---Pt started having back pain is at waist and a little above on R and now she just urinated straight blood 2x today. No fever Has the patient traveled out of the country within the last 30 days? ---No Does the patient have any new or worsening symptoms? ---Yes Will a triage be completed? ---Yes Related visit to physician within the last 2 weeks? ---No Does the PT have any chronic conditions? (i.e. diabetes, asthma, etc.) ---NoDid the patient indicate they were pregnant? ---No Is this a behavioral health or substance abuse call? ---No Guidelines Guideline Title Affirmed Question Affirmed Notes Urine - Blood In Passing pure blood or large blood clots (i.e., size > a dime) (Exception: fleck or small strands) Final Disposition User Go to ED Now Ladona RidgelGaddy, RN, Felicia [facility] Disagree/Comply: Comply  Call Id: [phone]

## 2015-01-29 NOTE — ED Provider Notes (Signed)
CSN: [id number]     Arrival date & time 01/29/15  1519 History   First MD Initiated Contact with Patient 01/29/15 1656     Chief Complaint  Patient presents with  . Flank Pain    HPI  Patient is a 39 y/o WF here with a CC of R sided flank pain and emesis x2 that began this morning.  Pain was sudden in onset beginning as a dull ache which then progressed to stabbing unrelenting pain rated now as 10/10.  At 11:30 she urinated and noticed a pale pink color on the toilet paper.  Her next urination an hour later she noticed the toilet bowl was red, leading her to come to the ED.  Tylenol x3 at home and fentanyl on arrival have not relived her pain. Denies fever, dysuria, or urinary urgency.  PMH significant for a kidney stone 11 years ago treated with lithotripsy.  She has not had any urinary complications since that episode.     Past Medical History  Diagnosis Date  . Kidney stones   . Gallstones    Past Surgical History  Procedure Laterality Date  . Cholecystectomy    . Tubal ligation    . Dilation and curettage of uterus    . Ablation    . Lithotripsy     Family History  Problem Relation Age of Onset  . Diabetes Maternal Aunt   . Heart disease Maternal Grandfather   . Heart disease Maternal Uncle     x 2  . Irritable bowel syndrome Mother   . Cancer Maternal Aunt     kidney  . Thyroid disease Mother   . Asthma Mother    Social History  Substance Use Topics  . Smoking status: Former Smoker    Quit date: 12/09/2008  . Smokeless tobacco: Never Used  . Alcohol Use: No   OB History    No data available     Review of Systems All other systems negative except as documented in the HPI. All pertinent positives and negatives as reviewed in the HPI.    Allergies  Review of patient's allergies indicates no known allergies.  Home Medications   Prior to Admission medications   Medication Sig Start Date End Date Taking? Authorizing Provider  butalbital-acetaminophen-caffeine  (FIORICET, ESGIC) [phone] MG per tablet One every 6 to 8 hours as needed 04/08/14  Yes Historical Provider, MD  FLUoxetine (PROZAC) 20 MG capsule Take 20 mg by mouth daily as needed (for anxiety).   Yes Historical Provider, MD  Aspirin-Acetaminophen-Caffeine (EXCEDRIN MIGRAINE PO) Take by mouth.    Historical Provider, MD   BP 132/79 mmHg  Pulse 86  Temp(Src) 97.8 F (36.6 C) (Oral)  Resp 15  SpO2 100%  LMP 01/20/2015 Physical Exam  Constitutional: She is oriented to person, place, and time. She appears well-developed and well-nourished. No distress.  HENT:  Head: Normocephalic and atraumatic.  Mouth/Throat: Oropharynx is clear and moist.  Eyes: Pupils are equal, round, and reactive to light.  Neck: Normal range of motion. Neck supple.  Cardiovascular: Normal rate and regular rhythm.   Pulmonary/Chest: Effort normal and breath sounds normal. No respiratory distress. She has no wheezes.  Abdominal: Soft. Bowel sounds are normal. She exhibits no distension. There is no tenderness. There is CVA tenderness.  CVA tenderness R sided.  Neurological: She is alert and oriented to person, place, and time. She exhibits normal muscle tone. Coordination normal.  Skin: Skin is warm and dry. No rash noted. No  erythema.  Psychiatric: She has a normal mood and affect. Her behavior is normal.  Nursing note and vitals reviewed.      ED Course  Procedures (including critical care time) Labs Review Labs Reviewed  URINALYSIS, ROUTINE W REFLEX MICROSCOPIC (NOT AT [facility]) - Abnormal; Notable for the following:    Color, Urine RED (*)    APPearance TURBID (*)    Hgb urine dipstick LARGE (*)    Bilirubin Urine MODERATE (*)    Ketones, ur 15 (*)    Protein, ur 100 (*)    Leukocytes, UA SMALL (*)    All other components within normal limits  COMPREHENSIVE METABOLIC PANEL - Abnormal; Notable for the following:    Glucose, Bld 108 (*)    All other components within normal limits  URINE MICROSCOPIC-ADD  ON - Abnormal; Notable for the following:    Squamous Epithelial / LPF 0-5 (*)    Bacteria, UA RARE (*)    All other components within normal limits  CBC WITH DIFFERENTIAL/PLATELET  I-STAT BETA HCG BLOOD, ED ([facility], [facility], [facility] ONLY)    Imaging Review No results found. I have personally reviewed and evaluated these images and lab results as part of my medical decision-making.   EKG Interpretation None      Assessment & Plan R flank pain   - Pain relief with dilaudid   - Abdominal CT   The will be advised follow-up with urology.  I spoke with the urologist who states that the patient will need to follow closely in their office.  Patient is pain-free at this time  Charlestine Night, PA-C 02/04/15 1610  Donnetta Hutching, MD 02/05/15 [phone]

## 2015-01-29 NOTE — ED Notes (Signed)
Pt ambulated to restroom. 

## 2015-01-29 NOTE — Discharge Instructions (Signed)
Followup with the urologist provided.  Return here as needed °

## 2015-01-29 NOTE — ED Notes (Signed)
Pt reports at 11 am sudden right flank pain, is having hematuria and pain has worsened. Has hx of kidney stones. Pt is restless, appears very uncomfortable.

## 2015-02-02 ENCOUNTER — Other Ambulatory Visit: Payer: Self-pay | Admitting: Urology

## 2015-02-04 ENCOUNTER — Other Ambulatory Visit: Payer: Self-pay | Admitting: Urology

## 2015-02-04 ENCOUNTER — Encounter (HOSPITAL_COMMUNITY): Payer: Self-pay | Admitting: *Deleted

## 2015-02-05 ENCOUNTER — Encounter (HOSPITAL_COMMUNITY)
Admission: RE | Disposition: A | Discharge status: Home / Self Care | Payer: Self-pay | Source: Ambulatory Visit | Attending: Urology

## 2015-02-05 ENCOUNTER — Ambulatory Visit (HOSPITAL_COMMUNITY)
Admission: RE | Admit: 2015-02-05 | Discharge: 2015-02-05 | Disposition: A | Discharge status: Home / Self Care | Payer: Self-pay | Source: Ambulatory Visit | Attending: Urology | Admitting: Urology

## 2015-02-05 ENCOUNTER — Ambulatory Visit (HOSPITAL_COMMUNITY): Payer: Self-pay

## 2015-02-05 ENCOUNTER — Encounter (HOSPITAL_COMMUNITY): Payer: Self-pay | Admitting: General Practice

## 2015-02-05 DIAGNOSIS — Z6841 Body Mass Index (BMI) 40.0 and over, adult: Secondary | ICD-10-CM | POA: Insufficient documentation

## 2015-02-05 DIAGNOSIS — N200 Calculus of kidney: Secondary | ICD-10-CM | POA: Insufficient documentation

## 2015-02-05 DIAGNOSIS — E669 Obesity, unspecified: Secondary | ICD-10-CM | POA: Insufficient documentation

## 2015-02-05 HISTORY — DX: Nausea with vomiting, unspecified: R11.2

## 2015-02-05 HISTORY — DX: Other specified postprocedural states: Z98.890

## 2015-02-05 SURGERY — LITHOTRIPSY, ESWL
Anesthesia: LOCAL | Laterality: Right

## 2015-02-05 MED ORDER — LEVOFLOXACIN 500 MG PO TABS
500.0000 mg | ORAL_TABLET | ORAL | Status: AC
Start: 2015-02-05 — End: 2015-02-05
  Administered 2015-02-05: 500 mg via ORAL
  Filled 2015-02-05: qty 1

## 2015-02-05 MED ORDER — SODIUM CHLORIDE 0.9 % IV SOLN
INTRAVENOUS | Status: DC
  Administered 2015-02-05: 10:00:00 via INTRAVENOUS

## 2015-02-05 MED ORDER — DIPHENHYDRAMINE HCL 25 MG PO CAPS
25.0000 mg | ORAL_CAPSULE | ORAL | Status: AC
Start: 2015-02-05 — End: 2015-02-05
  Administered 2015-02-05: 25 mg via ORAL
  Filled 2015-02-05: qty 1

## 2015-02-05 MED ORDER — DIAZEPAM 5 MG PO TABS
10.0000 mg | ORAL_TABLET | ORAL | Status: AC
  Administered 2015-02-05: 10 mg via ORAL
  Filled 2015-02-05: qty 2

## 2015-02-25 ENCOUNTER — Other Ambulatory Visit: Payer: Self-pay | Admitting: Obstetrics and Gynecology

## 2015-02-25 DIAGNOSIS — N6320 Unspecified lump in the left breast, unspecified quadrant: Secondary | ICD-10-CM

## 2015-03-05 ENCOUNTER — Ambulatory Visit (INDEPENDENT_AMBULATORY_CARE_PROVIDER_SITE_OTHER): Payer: Self-pay | Admitting: Internal Medicine

## 2015-03-05 ENCOUNTER — Other Ambulatory Visit (INDEPENDENT_AMBULATORY_CARE_PROVIDER_SITE_OTHER): Payer: Self-pay

## 2015-03-05 ENCOUNTER — Encounter: Payer: Self-pay | Admitting: Internal Medicine

## 2015-03-05 VITALS — BP 118/82 | HR 101 | Temp 98.3°F | Resp 16 | Ht 60.0 in | Wt 230.4 lb

## 2015-03-05 DIAGNOSIS — M25561 Pain in right knee: Secondary | ICD-10-CM

## 2015-03-05 DIAGNOSIS — M25569 Pain in unspecified knee: Secondary | ICD-10-CM | POA: Insufficient documentation

## 2015-03-05 DIAGNOSIS — M25562 Pain in left knee: Secondary | ICD-10-CM

## 2015-03-05 LAB — CBC
HCT: 41.3 % (ref 36.0–46.0)
HEMOGLOBIN: 13.6 g/dL (ref 12.0–15.0)
MCHC: 32.8 g/dL (ref 30.0–36.0)
MCV: 88.4 fl (ref 78.0–100.0)
PLATELETS: 370 10*3/uL (ref 150.0–400.0)
RBC: 4.68 Mil/uL (ref 3.87–5.11)
RDW: 13 % (ref 11.5–15.5)
WBC: 12.1 10*3/uL — ABNORMAL HIGH (ref 4.0–10.5)

## 2015-03-05 LAB — COMPREHENSIVE METABOLIC PANEL
ALK PHOS: 76 U/L (ref 39–117)
ALT: 16 U/L (ref 0–35)
AST: 12 U/L (ref 0–37)
Albumin: 4 g/dL (ref 3.5–5.2)
BILIRUBIN TOTAL: 0.4 mg/dL (ref 0.2–1.2)
BUN: 11 mg/dL (ref 6–23)
CALCIUM: 9.5 mg/dL (ref 8.4–10.5)
CO2: 25 mEq/L (ref 19–32)
Chloride: 103 mEq/L (ref 96–112)
Creatinine, Ser: 0.58 mg/dL (ref 0.40–1.20)
GFR: 122.23 mL/min (ref >60.00)
Glucose, Bld: 94 mg/dL (ref 70–99)
POTASSIUM: 4.3 meq/L (ref 3.5–5.1)
Sodium: 137 mEq/L (ref 135–145)
TOTAL PROTEIN: 7.1 g/dL (ref 6.0–8.3)

## 2015-03-05 LAB — SEDIMENTATION RATE: Sed Rate: 26 mm/hr — ABNORMAL HIGH (ref 0–22)

## 2015-03-05 LAB — MAGNESIUM: MAGNESIUM: 1.9 mg/dL (ref 1.5–2.5)

## 2015-03-05 LAB — CK: CK TOTAL: 45 U/L (ref 7–177)

## 2015-03-05 LAB — FERRITIN: Ferritin: 64.7 ng/mL (ref 10.0–291.0)

## 2015-03-05 MED ORDER — CYCLOBENZAPRINE HCL 5 MG PO TABS: 5.0000 mg | ORAL_TABLET | Freq: Three times a day (TID) | ORAL | Status: DC | PRN

## 2015-03-05 NOTE — Progress Notes (Signed)
   Subjective:    Patient ID: Melody Taylor, female    DOB: [birth year], 40 y.o.   MRN: [id number]  HPI The patient is a 40 YO female coming in for leg pain for over 1 month. They hurt in the morning and it gets worse over the course of the day until it is so intense she is unable to do anything. She has been using tylenol which has not helped at all. She had some leftover oxycodone and tried this at night time which is somewhat helpful but she wakes up in pain by 2AM. She denies any skin rash, color change, injury. Not doing more exercise. No change to diet. She did just have lithotripsy in December but that is doing better now. No fevers or chills. Rates it 3/10 most of the day and 10/10 in the evening from 4PM on.   Review of Systems  Constitutional: Negative for fever, activity change, appetite change, fatigue and unexpected weight change.  Respiratory: Negative for cough, chest tightness, shortness of breath and wheezing.   Cardiovascular: Negative for chest pain, palpitations and leg swelling.  Gastrointestinal: Negative for abdominal pain, diarrhea, constipation and abdominal distention.  Musculoskeletal: Positive for myalgias, arthralgias and gait problem.  Skin: Negative.   Neurological: Negative.   Psychiatric/Behavioral: Negative.       Objective:   Physical Exam  Constitutional: She is oriented to person, place, and time. She appears well-developed and well-nourished.  Overweight  HENT:  Head: Normocephalic and atraumatic.  Eyes: EOM are normal.  Neck: Normal range of motion.  Cardiovascular: Normal rate and regular rhythm.   Carotids without bruits.  Pulmonary/Chest: Effort normal and breath sounds normal. No respiratory distress. She has no wheezes. She has no rales.  Abdominal: Soft. Bowel sounds are normal.  Musculoskeletal: She exhibits tenderness.  Tenderness in the muscles upper and lower bilateral legs, no swelling, no skin color change, no fluid in the joints or  swelling in the joints. No pain on lower back palpation.   Neurological: She is alert and oriented to person, place, and time. Coordination normal.  Skin: Skin is warm and dry.  Psychiatric: She has a normal mood and affect.   Filed Vitals:   03/05/15 0843  BP: 118/82  Pulse: 101  Temp: 98.3 F (36.8 C)  TempSrc: Oral  Resp: 16  Height: 5' (1.524 m)  Weight: 230 lb 6.4 oz (104.509 kg)  SpO2: 98%      Assessment & Plan:

## 2015-03-05 NOTE — Patient Instructions (Signed)
We are checking some blood work today to see if we can find a cause for the pain in your legs.  In the meantime we are sending in a muscle relaxer called flexeril to help with the pain. You can take it up to 3 times per day.

## 2015-03-05 NOTE — Assessment & Plan Note (Signed)
Unclear etiology. Checking electrolytes and CBC to rule out metabolic cause. Checking CK and ESR and ANA for possible rheumatologic cause. Rx for flexeril to see if this helps. Appears to be muscular in origin as the joints are not the primary focus of pain. No weakness in the legs and no upper body or central symptoms. No low back pain. No systemic symptoms or skin changes.

## 2015-03-05 NOTE — Progress Notes (Signed)
Pre visit review using our clinic review tool, if applicable. No additional management support is needed unless otherwise documented below in the visit note. 

## 2015-03-06 LAB — ANA: Anti Nuclear Antibody(ANA): NEGATIVE

## 2015-04-02 ENCOUNTER — Ambulatory Visit
Admission: RE | Admit: 2015-04-02 | Discharge: 2015-04-02 | Disposition: A | Discharge status: Home / Self Care | Payer: No Typology Code available for payment source | Source: Ambulatory Visit | Attending: Obstetrics and Gynecology | Admitting: Obstetrics and Gynecology

## 2015-04-02 DIAGNOSIS — N6320 Unspecified lump in the left breast, unspecified quadrant: Secondary | ICD-10-CM

## 2015-04-29 DIAGNOSIS — K219 Gastro-esophageal reflux disease without esophagitis: Secondary | ICD-10-CM

## 2015-04-29 HISTORY — DX: Gastro-esophageal reflux disease without esophagitis: K21.9

## 2015-05-08 ENCOUNTER — Emergency Department (HOSPITAL_COMMUNITY)
Admission: EM | Admit: 2015-05-08 | Discharge: 2015-05-09 | Disposition: A | Discharge status: Home / Self Care | Payer: No Typology Code available for payment source | Attending: Emergency Medicine | Admitting: Emergency Medicine

## 2015-05-08 ENCOUNTER — Emergency Department (HOSPITAL_COMMUNITY): Payer: No Typology Code available for payment source

## 2015-05-08 ENCOUNTER — Encounter (HOSPITAL_COMMUNITY): Payer: Self-pay | Admitting: Emergency Medicine

## 2015-05-08 DIAGNOSIS — Z87442 Personal history of urinary calculi: Secondary | ICD-10-CM | POA: Insufficient documentation

## 2015-05-08 DIAGNOSIS — R000 Tachycardia, unspecified: Secondary | ICD-10-CM | POA: Insufficient documentation

## 2015-05-08 DIAGNOSIS — R0789 Other chest pain: Secondary | ICD-10-CM | POA: Insufficient documentation

## 2015-05-08 DIAGNOSIS — Z79899 Other long term (current) drug therapy: Secondary | ICD-10-CM | POA: Insufficient documentation

## 2015-05-08 DIAGNOSIS — R0602 Shortness of breath: Secondary | ICD-10-CM | POA: Insufficient documentation

## 2015-05-08 LAB — BASIC METABOLIC PANEL
Anion gap: 12 (ref 5–15)
BUN: 11 mg/dL (ref 6–20)
CHLORIDE: 102 mmol/L (ref 101–111)
CO2: 22 mmol/L (ref 22–32)
Calcium: 9 mg/dL (ref 8.9–10.3)
Creatinine, Ser: 0.69 mg/dL (ref 0.44–1.00)
GFR calc Af Amer: >60 mL/min (ref >60)
GFR calc non Af Amer: >60 mL/min (ref >60)
Glucose, Bld: 78 mg/dL (ref 65–99)
Potassium: 3.5 mmol/L (ref 3.5–5.1)
SODIUM: 136 mmol/L (ref 135–145)

## 2015-05-08 LAB — CBC
HCT: 41.6 % (ref 36.0–46.0)
Hemoglobin: 13.5 g/dL (ref 12.0–15.0)
MCH: 28.8 pg (ref 26.0–34.0)
MCHC: 32.5 g/dL (ref 30.0–36.0)
MCV: 88.9 fL (ref 78.0–100.0)
Platelets: 371 10*3/uL (ref 150–400)
RBC: 4.68 MIL/uL (ref 3.87–5.11)
RDW: 13.4 % (ref 11.5–15.5)
WBC: 13.4 10*3/uL — AB (ref 4.0–10.5)

## 2015-05-08 LAB — I-STAT TROPONIN, ED: Troponin i, poc: 0 ng/mL (ref 0.00–0.08)

## 2015-05-08 LAB — D-DIMER, QUANTITATIVE: D-Dimer, Quant: <0.27 ug/mL-FEU (ref 0.00–0.50)

## 2015-05-08 MED ORDER — KETOROLAC TROMETHAMINE 30 MG/ML IJ SOLN
30.0000 mg | Freq: Once | INTRAMUSCULAR | Status: AC
  Administered 2015-05-08: 30 mg via INTRAVENOUS
  Filled 2015-05-08: qty 1

## 2015-05-08 MED ORDER — GI COCKTAIL ~~LOC~~
30.0000 mL | Freq: Once | ORAL | Status: AC
  Administered 2015-05-08: 30 mL via ORAL
  Filled 2015-05-08: qty 30

## 2015-05-08 NOTE — ED Provider Notes (Signed)
CSN: [id number]     Arrival date & time 05/08/15  1606 History   First MD Initiated Contact with Patient 05/08/15 2250     Chief Complaint  Patient presents with  . Chest Pain     (Consider location/radiation/quality/duration/timing/severity/associated sxs/prior Treatment) HPI Comments: Patient presents with left-sided chest and upper back pain onset around 11:30 AM it has been constant all day. She states this started while she was at a nail salon at rest. There is no trauma. She thought the pain was indigestion with a Gas-X with no relief. Pain is been constant and never goes away. There is no shortness of breath but it does hurt to take a big breath. No cough or fever. Pain radiates from left chest and left upper back. Does not go to neck or arm. There is no nausea or vomiting. There is no syncope. She has not had this kind of pain in the past. Denies any leg pain or leg swelling. No personal cardiac history. There is a family history of MI in the 60s. She's not had any kind of birth control. No fevers, chills, cough or nausea or vomiting.  The history is provided by the patient.    Past Medical History  Diagnosis Date  . Kidney stones   . Gallstones   . PONV (postoperative nausea and vomiting)    Past Surgical History  Procedure Laterality Date  . Cholecystectomy    . Tubal ligation    . Dilation and curettage of uterus    . Ablation    . Lithotripsy     Family History  Problem Relation Age of Onset  . Diabetes Maternal Aunt   . Heart disease Maternal Grandfather   . Heart disease Maternal Uncle     x 2  . Irritable bowel syndrome Mother   . Cancer Maternal Aunt     kidney  . Thyroid disease Mother   . Asthma Mother    Social History  Substance Use Topics  . Smoking status: Never Smoker   . Smokeless tobacco: Never Used  . Alcohol Use: No   OB History    No data available     Review of Systems  Constitutional: Negative for fever, activity change and appetite  change.  HENT: Negative for congestion and rhinorrhea.   Respiratory: Positive for chest tightness and shortness of breath. Negative for cough.   Cardiovascular: Positive for chest pain.  Gastrointestinal: Negative for nausea, vomiting and abdominal pain.  Genitourinary: Negative for dysuria, hematuria, vaginal bleeding and vaginal discharge.  Musculoskeletal: Negative for myalgias and arthralgias.  Skin: Negative for rash.  Neurological: Negative for dizziness, weakness, light-headedness and headaches.  A complete 10 system review of systems was obtained and all systems are negative except as noted in the HPI and PMH.      Allergies  Review of patient's allergies indicates no known allergies.  Home Medications   Prior to Admission medications   Medication Sig Start Date End Date Taking? Authorizing Provider  butalbital-acetaminophen-caffeine (FIORICET, ESGIC) 50-325-40 MG per tablet Take 1 tablet by mouth every 6 (six) hours as needed for migraine. One every 6 to 8 hours as needed 04/08/14  Yes Historical Provider, MD  FLUoxetine (PROZAC) 20 MG capsule Take 20 mg by mouth daily as needed (for anxiety).   Yes Historical Provider, MD  tamsulosin (FLOMAX) 0.4 MG CAPS capsule Take 0.4 mg by mouth daily as needed (for stones).  02/05/15  Yes Historical Provider, MD  cyclobenzaprine (FLEXERIL) 5 MG  tablet Take 1 tablet (5 mg total) by mouth 3 (three) times daily as needed for muscle spasms. 03/05/15   Myrlene Broker, MD  naproxen (NAPROSYN) 500 MG tablet Take 1 tablet (500 mg total) by mouth 2 (two) times daily. 05/09/15   Glynn Octave, MD  oxyCODONE-acetaminophen (PERCOCET/ROXICET) 5-325 MG tablet Take 1 tablet by mouth every 4 (four) hours as needed for severe pain. 01/29/15   Christopher Lawyer, PA-C   BP 140/90 mmHg  Pulse 107  Temp(Src) 98.8 F (37.1 C) (Oral)  Resp 15  Ht 5' (1.524 m)  Wt 230 lb (104.327 kg)  BMI 44.92 kg/m2  SpO2 99%  LMP 04/30/2015 (Exact Date) Physical  Exam  Constitutional: She is oriented to person, place, and time. She appears well-developed and well-nourished. No distress.  HENT:  Head: Normocephalic and atraumatic.  Mouth/Throat: Oropharynx is clear and moist. No oropharyngeal exudate.  Eyes: Conjunctivae and EOM are normal. Pupils are equal, round, and reactive to light.  Neck: Normal range of motion. Neck supple.  No meningismus.  Cardiovascular: Normal rate, normal heart sounds and intact distal pulses.   No murmur heard. Tachycardic to 110s  Pulmonary/Chest: Effort normal and breath sounds normal. No respiratory distress. She exhibits tenderness.  Chaperone present. There is tenderness to palpation of her left anterior chest wall and sternum above her breast. There is no rash. There is tenderness to palpation of the left thoracic back.  Abdominal: Soft. There is no tenderness. There is no rebound and no guarding.  Musculoskeletal: Normal range of motion. She exhibits no edema or tenderness.  Neurological: She is alert and oriented to person, place, and time. No cranial nerve deficit. She exhibits normal muscle tone. Coordination normal.  No ataxia on finger to nose bilaterally. No pronator drift. 5/5 strength throughout. CN 2-12 intact.Equal grip strength. Sensation intact.   Skin: Skin is warm.  Psychiatric: She has a normal mood and affect. Her behavior is normal.  Nursing note and vitals reviewed.   ED Course  Procedures (including critical care time) Labs Review Labs Reviewed  CBC - Abnormal; Notable for the following:    WBC 13.4 (*)    All other components within normal limits  BASIC METABOLIC PANEL  TROPONIN I  D-DIMER, QUANTITATIVE (NOT AT [facility])  Rosezena Sensor, ED    Imaging Review Dg Chest 2 View  05/08/2015  CLINICAL DATA:  Chest pain for 1 day EXAM: CHEST  2 VIEW COMPARISON:  02/06/2012 FINDINGS: Upper normal heart size. Minimally prominent RIGHT paratracheal soft tissues appear grossly unchanged.  Mediastinal contours and pulmonary vascularity otherwise normal. Lungs clear. No pleural effusion or pneumothorax. Bones unremarkable. IMPRESSION: No acute abnormalities. Electronically Signed   By: Ulyses Southward M.D.   On: 05/08/2015 17:01   I have personally reviewed and evaluated these images and lab results as part of my medical decision-making.   EKG Interpretation   Date/Time:  Friday May 08 2015 16:24:37 EST Ventricular Rate:  112 PR Interval:  130 QRS Duration: 72 QT Interval:  322 QTC Calculation: 439 R Axis:   62 Text Interpretation:  Sinus tachycardia Otherwise normal ECG Rate faster  Confirmed by Aidyn Sportsman  MD, Pape Parson ([id number]) on 05/08/2015 10:49:57 PM      MDM   Final diagnoses:  Chest wall pain   11.5 hours of constant left chest and upper back pain. Soreness to palpation.No rash.  EKG is sinus tachycardia. Chest x-ray is negative. Low suspicion for ACS. Troponin negative.  Chest wall is sore to  palpation. Suspect muscular skeletal pain but with persistent tachycardia we'll check d-dimer.  Troponin negative 2. D-dimer negative. Pain much improved after Toradol and GI cocktail.  Patient reassured this pain is likely musculoskeletal. No evidence of ACS or PE. No pneumonia or pneumothorax. Continue anti-inflammatories, follow-up with PCP, return as discussed.  Glynn OctaveStephen Mario Coronado, MD 05/09/15 0100

## 2015-05-08 NOTE — ED Notes (Signed)
Pt from home with c/o left CP radiating to left shoulder/neck and left arm.  Pt reports she thought was having indigestion and took gas x with no relief.  Went to [facility] where they told her she would just be transferred here.  They did not do an EKG on her at that time or give any medications.  Pt reports nausea.  Denies SOB or emesis.  NAD, A&O.

## 2015-05-09 LAB — TROPONIN I: Troponin I: <0.03 ng/mL (ref <0.031)

## 2015-05-09 MED ORDER — NAPROXEN 500 MG PO TABS: 500.0000 mg | ORAL_TABLET | Freq: Two times a day (BID) | ORAL | Status: DC

## 2015-05-09 NOTE — Discharge Instructions (Signed)
Chest Wall Pain There is no evidence of heart attack or blood clot in lung. Take the antiinflammatory medications. Return to the ED if you develop new or worsening symptoms. Chest wall pain is pain in or around the bones and muscles of your chest. Sometimes, an injury causes this pain. Sometimes, the cause may not be known. This pain may take several weeks or longer to get better. HOME CARE INSTRUCTIONS  Pay attention to any changes in your symptoms. Take these actions to help with your pain:   Rest as told by your health care provider.   Avoid activities that cause pain. These include any activities that use your chest muscles or your abdominal and side muscles to lift heavy items.   If directed, apply ice to the painful area:  Put ice in a plastic bag.  Place a towel between your skin and the bag.  Leave the ice on for 20 minutes, 2-3 times per day.  Take over-the-counter and prescription medicines only as told by your health care provider.  Do not use tobacco products, including cigarettes, chewing tobacco, and e-cigarettes. If you need help quitting, ask your health care provider.  Keep all follow-up visits as told by your health care provider. This is important. SEEK MEDICAL CARE IF:  You have a fever.  Your chest pain becomes worse.  You have new symptoms. SEEK IMMEDIATE MEDICAL CARE IF:  You have nausea or vomiting.  You feel sweaty or light-headed.  You have a cough with phlegm (sputum) or you cough up blood.  You develop shortness of breath.   This information is not intended to replace advice given to you by your health care provider. Make sure you discuss any questions you have with your health care provider.   Document Released: 02/14/2005 Document Revised: 11/05/2014 Document Reviewed: 05/12/2014 Elsevier Interactive Patient Education [employer]2016 Elsevier Inc.

## 2015-05-09 NOTE — ED Notes (Signed)
Pt left with all her belongings and ambulated out of the treatment area.  

## 2015-09-14 ENCOUNTER — Other Ambulatory Visit: Payer: Self-pay | Admitting: Obstetrics and Gynecology

## 2015-09-14 DIAGNOSIS — N63 Unspecified lump in unspecified breast: Secondary | ICD-10-CM

## 2015-10-01 ENCOUNTER — Ambulatory Visit
Admission: RE | Admit: 2015-10-01 | Discharge: 2015-10-01 | Disposition: A | Discharge status: Home / Self Care | Payer: No Typology Code available for payment source | Source: Ambulatory Visit | Attending: Obstetrics and Gynecology | Admitting: Obstetrics and Gynecology

## 2015-10-01 DIAGNOSIS — N63 Unspecified lump in unspecified breast: Secondary | ICD-10-CM

## 2016-01-14 ENCOUNTER — Other Ambulatory Visit: Payer: Self-pay | Admitting: Urology

## 2016-01-19 ENCOUNTER — Encounter (HOSPITAL_COMMUNITY): Payer: Self-pay | Admitting: *Deleted

## 2016-01-28 ENCOUNTER — Ambulatory Visit (HOSPITAL_COMMUNITY)
Admission: RE | Admit: 2016-01-28 | Payer: No Typology Code available for payment source | Source: Ambulatory Visit | Admitting: Urology

## 2016-01-28 SURGERY — LITHOTRIPSY, ESWL
Anesthesia: LOCAL | Laterality: Left

## 2016-03-10 ENCOUNTER — Other Ambulatory Visit: Payer: Self-pay | Admitting: Urology

## 2016-03-10 ENCOUNTER — Emergency Department (HOSPITAL_COMMUNITY)
Admission: EM | Admit: 2016-03-10 | Discharge: 2016-03-10 | Discharge status: Absent without leave | Payer: No Typology Code available for payment source

## 2016-03-10 NOTE — ED Notes (Signed)
According to registration pt left because primary said that they would see her.

## 2016-03-11 ENCOUNTER — Encounter (HOSPITAL_COMMUNITY): Payer: Self-pay | Admitting: *Deleted

## 2016-03-14 ENCOUNTER — Ambulatory Visit (HOSPITAL_COMMUNITY): Payer: Self-pay

## 2016-03-14 ENCOUNTER — Encounter (HOSPITAL_COMMUNITY)
Admission: RE | Disposition: A | Discharge status: Home / Self Care | Payer: Self-pay | Source: Ambulatory Visit | Attending: Urology

## 2016-03-14 ENCOUNTER — Ambulatory Visit (HOSPITAL_COMMUNITY)
Admission: RE | Admit: 2016-03-14 | Discharge: 2016-03-14 | Disposition: A | Discharge status: Home / Self Care | Payer: Self-pay | Source: Ambulatory Visit | Attending: Urology | Admitting: Urology

## 2016-03-14 ENCOUNTER — Encounter (HOSPITAL_COMMUNITY): Payer: Self-pay | Admitting: *Deleted

## 2016-03-14 DIAGNOSIS — F17200 Nicotine dependence, unspecified, uncomplicated: Secondary | ICD-10-CM | POA: Insufficient documentation

## 2016-03-14 DIAGNOSIS — N201 Calculus of ureter: Secondary | ICD-10-CM | POA: Insufficient documentation

## 2016-03-14 DIAGNOSIS — Z79899 Other long term (current) drug therapy: Secondary | ICD-10-CM | POA: Insufficient documentation

## 2016-03-14 DIAGNOSIS — Z8659 Personal history of other mental and behavioral disorders: Secondary | ICD-10-CM | POA: Insufficient documentation

## 2016-03-14 DIAGNOSIS — Z8719 Personal history of other diseases of the digestive system: Secondary | ICD-10-CM | POA: Insufficient documentation

## 2016-03-14 DIAGNOSIS — Z8051 Family history of malignant neoplasm of kidney: Secondary | ICD-10-CM | POA: Insufficient documentation

## 2016-03-14 DIAGNOSIS — Z9049 Acquired absence of other specified parts of digestive tract: Secondary | ICD-10-CM | POA: Insufficient documentation

## 2016-03-14 HISTORY — DX: Personal history of urinary calculi: Z87.442

## 2016-03-14 HISTORY — DX: Gastro-esophageal reflux disease without esophagitis: K21.9

## 2016-03-14 HISTORY — PX: EXTRACORPOREAL SHOCK WAVE LITHOTRIPSY: SHX1557

## 2016-03-14 LAB — PREGNANCY, URINE: Preg Test, Ur: NEGATIVE

## 2016-03-14 SURGERY — LITHOTRIPSY, ESWL
Anesthesia: LOCAL | Laterality: Left

## 2016-03-14 MED ORDER — ACETAMINOPHEN 650 MG RE SUPP: 650.0000 mg | RECTAL | Status: DC | PRN

## 2016-03-14 MED ORDER — SODIUM CHLORIDE 0.9 % IV SOLN: 250.0000 mL | INTRAVENOUS | Status: DC | PRN

## 2016-03-14 MED ORDER — FENTANYL CITRATE (PF) 100 MCG/2ML IJ SOLN: 25.0000 ug | INTRAMUSCULAR | Status: DC | PRN

## 2016-03-14 MED ORDER — CIPROFLOXACIN HCL 500 MG PO TABS
500.0000 mg | ORAL_TABLET | ORAL | Status: AC
  Administered 2016-03-14: 500 mg via ORAL
  Filled 2016-03-14: qty 1

## 2016-03-14 MED ORDER — SODIUM CHLORIDE 0.9% FLUSH: 3.0000 mL | Freq: Two times a day (BID) | INTRAVENOUS | Status: DC

## 2016-03-14 MED ORDER — HYDROMORPHONE HCL 2 MG PO TABS: 2.0000 mg | ORAL_TABLET | Freq: Four times a day (QID) | ORAL | 0 refills | Status: DC | PRN

## 2016-03-14 MED ORDER — ACETAMINOPHEN 325 MG PO TABS: 650.0000 mg | ORAL_TABLET | ORAL | Status: DC | PRN

## 2016-03-14 MED ORDER — DIPHENHYDRAMINE HCL 25 MG PO CAPS
25.0000 mg | ORAL_CAPSULE | ORAL | Status: AC
  Administered 2016-03-14: 25 mg via ORAL
  Filled 2016-03-14: qty 1

## 2016-03-14 MED ORDER — OXYCODONE HCL 5 MG PO TABS: 5.0000 mg | ORAL_TABLET | ORAL | Status: DC | PRN

## 2016-03-14 MED ORDER — DIAZEPAM 5 MG PO TABS
10.0000 mg | ORAL_TABLET | ORAL | Status: AC
  Administered 2016-03-14: 10 mg via ORAL
  Filled 2016-03-14: qty 2

## 2016-03-14 MED ORDER — SODIUM CHLORIDE 0.9% FLUSH: 3.0000 mL | INTRAVENOUS | Status: DC | PRN

## 2016-03-14 MED ORDER — SODIUM CHLORIDE 0.9 % IV SOLN
INTRAVENOUS | Status: DC
  Administered 2016-03-14: 07:00:00 via INTRAVENOUS

## 2016-03-14 NOTE — Discharge Instructions (Addendum)
IF YOU HAVE NOT HEARD FROM [facility] BY Wednesday. CALL OFFICE ABOUT YOUR UPCOMING APPOINTMENT DATE AND TIME   Lithotripsy, Care After Refer to this sheet in the next few weeks. These instructions provide you with information on caring for yourself after your procedure. Your health care provider may also give you more specific instructions. Your treatment has been planned according to current medical practices, but problems sometimes occur. Call your health care provider if you have any problems or questions after your procedure. WHAT TO EXPECT AFTER THE PROCEDURE   Your urine may have a red tinge for a few days after treatment. Blood loss is usually minimal.  You may have soreness in the back or flank area. This usually goes away after a few days. The procedure can cause blotches or bruises on the back where the pressure wave enters the skin. These marks usually cause only minimal discomfort and should disappear in a short time.  Stone fragments should begin to pass within 24 hours of treatment. However, a delayed passage is not unusual.  You may have pain, discomfort, and feel sick to your stomach (nauseated) when the crushed fragments of stone are passed down the tube from the kidney to the bladder. Stone fragments can pass soon after the procedure and may last for up to 4-8 weeks.  A small number of patients may have severe pain when stone fragments are not able to pass, which leads to an obstruction.  If your stone is greater than 1 inch (2.5 cm) in diameter or if you have multiple stones that have a combined diameter greater than 1 inch (2.5 cm), you may require more than one treatment.  If you had a stent placed prior to your procedure, you may experience some discomfort, especially during urination. You may experience the pain or discomfort in your flank or back, or you may experience a sharp pain or discomfort at the base of your penis or in your lower abdomen. The discomfort  usually lasts only a few minutes after urinating. HOME CARE INSTRUCTIONS   Rest at home until you feel your energy improving.  Only take over-the-counter or prescription medicines for pain, discomfort, or fever as directed by your health care provider. Depending on the type of lithotripsy, you may need to take antibiotics and anti-inflammatory medicines for a few days.  Drink enough water and fluids to keep your urine clear or pale yellow. This helps "flush" your kidneys. It helps pass any remaining pieces of stone and prevents stones from coming back.  Most people can resume daily activities within 1-2 days after standard lithotripsy. It can take longer to recover from laser and percutaneous lithotripsy.  Strain all urine through the provided strainer. Keep all particulate matter and stones for your health care provider to see. The stone may be as small as a grain of salt. It is very important to use the strainer each and every time you pass your urine. Any stones that are found can be sent to a medical lab for examination.  Visit your health care provider for a follow-up appointment in a few weeks. Your doctor may remove your stent if you have one. Your health care provider will also check to see whether stone particles still remain. SEEK MEDICAL CARE IF:   Your pain is not relieved by medicine.  You have a lasting nauseous feeling.  You feel there is too much blood in the urine.  You develop persistent problems with frequent or painful urination that  does not at least partially improve after 2 days following the procedure.  You have a congested cough.  You feel lightheaded.  You develop a rash or any other signs that might suggest an allergic problem.  You develop any reaction or side effects to your medicine(s). SEEK IMMEDIATE MEDICAL CARE IF:   You experience severe back or flank pain or both.  You see nothing but blood when you urinate.  You cannot pass any urine at  all.  You have a fever or shaking chills.  You develop shortness of breath, difficulty breathing, or chest pain.  You develop vomiting that will not stop after 6-8 hours.  You have a fainting episode. This information is not intended to replace advice given to you by your health care provider. Make sure you discuss any questions you have with your health care provider. Document Released: 03/06/2007 Document Revised: 11/05/2014 Document Reviewed: 08/30/2012 Elsevier Interactive Patient Education  2017 Elsevier Inc.  Moderate Conscious Sedation, Adult, Care After These instructions provide you with information about caring for yourself after your procedure. Your health care provider may also give you more specific instructions. Your treatment has been planned according to current medical practices, but problems sometimes occur. Call your health care provider if you have any problems or questions after your procedure. What can I expect after the procedure? After your procedure, it is common:  To feel sleepy for several hours.  To feel clumsy and have poor balance for several hours.  To have poor judgment for several hours.  To vomit if you eat too soon. Follow these instructions at home: For at least 24 hours after the procedure:   Do not:  Participate in activities where you could fall or become injured.  Drive.  Use heavy machinery.  Drink alcohol.  Take sleeping pills or medicines that cause drowsiness.  Make important decisions or sign legal documents.  Take care of children on your own.  Rest. Eating and drinking  Follow the diet recommended by your health care provider.  If you vomit:  Drink water, juice, or soup when you can drink without vomiting.  Make sure you have little or no nausea before eating solid foods. General instructions  Have a responsible adult stay with you until you are awake and alert.  Take over-the-counter and prescription medicines  only as told by your health care provider.  If you smoke, do not smoke without supervision.  Keep all follow-up visits as told by your health care provider. This is important. Contact a health care provider if:  You keep feeling nauseous or you keep vomiting.  You feel light-headed.  You develop a rash.  You have a fever. Get help right away if:  You have trouble breathing. This information is not intended to replace advice given to you by your health care provider. Make sure you discuss any questions you have with your health care provider. Document Released: 12/05/2012 Document Revised: 07/20/2015 Document Reviewed: 06/06/2015 Elsevier Interactive Patient Education  2017 [employer]

## 2016-03-14 NOTE — H&P (Signed)
CC: I have pain in the flank.  HPI: Melody Taylor is a 41 year-old female established patient who is here for flank pain.  Has self treated with PRN Tramadol 50 mg 2 w/o relief.    The problem is on the left side. Her pain started about 03/10/2016. The pain is sharp. The intensity of her pain is rated as a <10. The pain is constant. The pain does radiate.   None< makes the pain better. Nothing causes the pain to become worse. She has not been treated with any pain medications.     CC: I have ureteral stone.  HPI: The problem is on the left side. She first stated noticing pain on approximately 12/30/2015. This is not her first kidney stone. She is currently having flank pain and nausea. She denies having back pain, groin pain, vomiting, fever, and chills. Pain is occuring on the left side. She has not caught a stone in her urine strainer since her symptoms began.   She has had eswl for treatment of her stones in the past.     ALLERGIES: No Allergies    MEDICATIONS: Excedrin Migraine 250 mg-250 mg-65 mg tablet 1 tablet PO Daily  Ultram 50 mg tablet 1 tablet PO Q 4 H PRN pain     GU PSH: D&C Non-OB - 01/30/2015 Renal ESWL - 03/12/2015    NON-GU PSH: Cholecystectomy - 01/30/2015    GU PMH: Kidney Stone (Worsening), left UPJ/renal pelvic stone, symptomatic. Hounsfield units approximately 800, skin to stone distance 12 cm. I think this is an appropriate stone for lithotripsy. - 01/13/2016, Nephrolithiasis, - 04/30/2015 Gross hematuria - 10/26/2015 Oth GU systems Signs/Symptoms, Bladder pain - 04/30/2015 Urinary Urgency, Urinary urgency - 04/30/2015    NON-GU PMH: Encounter for general adult medical examination without abnormal findings, Encounter for preventive health examination - 04/30/2015 Anxiety, Anxiety - 01/30/2015 Personal history of other diseases of the digestive system, History of esophageal reflux - 01/30/2015    FAMILY HISTORY: Kidney Cancer - Runs In Family Kidney Stones - Runs In  Family   SOCIAL HISTORY: Marital Status: Married Current Smoking Status: Patient smokes.  Has never drank.  Drinks 3 caffeinated drinks per day. 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    REVIEW OF SYSTEMS:    GU Review Female:   Patient denies frequent urination, hard to postpone urination, burning /pain with urination, get up at night to urinate, leakage of urine, stream starts and stops, trouble starting your stream, have to strain to urinate, and currently pregnant.  Gastrointestinal (Upper):   Patient reports nausea. Patient denies vomiting and indigestion/ heartburn.  Gastrointestinal (Lower):   flank pain. Patient denies diarrhea and constipation.  Constitutional:   Patient denies fever, night sweats, weight loss, and fatigue.  Skin:   Patient denies itching and skin rash/ lesion.  Eyes:   Patient denies blurred vision and double vision.  Ears/ Nose/ Throat:   Patient denies sore throat and sinus problems.  Hematologic/Lymphatic:   Patient denies swollen glands and easy bruising.  Cardiovascular:   Patient denies leg swelling and chest pains.  Respiratory:   Patient denies cough and shortness of breath.  Endocrine:   Patient denies excessive thirst.  Musculoskeletal:   Patient reports back pain. Patient denies joint pain.  Neurological:   Patient denies headaches and dizziness.  Psychologic:   Patient denies depression and anxiety.   VITAL SIGNS:      03/10/2016 11:32 AM  BP 145/81 mmHg  Pulse 92 /min  Temperature 92.0 F / 33 C   MULTI-SYSTEM PHYSICAL EXAMINATION:    Constitutional: Well-nourished. No physical deformities. Normally developed. Good grooming.   Respiratory: No labored breathing, no use of accessory muscles.   Cardiovascular: Normal temperature, normal extremity pulses, no swelling, no varicosities.   Skin: No paleness, no jaundice, no cyanosis. No lesion, no ulcer, no rash.   Neurologic / Psychiatric: Oriented to time, oriented to place,  oriented to person. No depression, no anxiety, no agitation.   Gastrointestinal: No mass, no tenderness, no rigidity, non obese abdomen.   Ears, Nose, Mouth, and Throat: Oral airway Class 3     PAST DATA REVIEWED:  Source Of History:  Patient  Records Review:   Previous Patient Records  Urine Test Review:   Urinalysis   PROCEDURES:         KUB - 16109  A single view of the abdomen is obtained.      No change in position of left ureteral calculus. Remains at L2.          Urinalysis w/Scope Dipstick Dipstick Cont'd Micro  Color: Yellow Bilirubin: Neg WBC/hpf: 0 - 5/hpf  Appearance: Cloudy Ketones: Neg RBC/hpf: 3 - 10/hpf  Specific Gravity: 1.025 Blood: 3+ Bacteria: Few (10-25/hpf)  pH: 5.5 Protein: Trace Cystals: Ca Oxalate  Glucose: Neg Urobilinogen: 0.2 Casts: NS (Not Seen)    Nitrites: Neg Trichomonas: Not Present    Leukocyte Esterase: Neg Mucous: Not Present      Epithelial Cells: 6 - 10/hpf      Yeast: NS (Not Seen)      Sperm: Not Present         Ketoralac 60mg  - [id number], [id number] Qty: 60 Adm. By: Sima Matas  Unit: mg Lot No [id number]  Route: IM Exp. Date 11/16/2016  Freq: None Mfgr.:   Site: Left Buttock         Phenergan 25mg  - [id number], [id number] Qty: 25 Adm. By: Sima Matas  Unit: mg Lot No [id number]  Route: IM Exp. Date 06/28/2017  Freq: None Mfgr.:   Site: Right Buttock   ASSESSMENT:      ICD-10 Details  1 GU:   Flank Pain - R10.84 Left, Acute - Ketorolac 60 mg IM. Hydromorphone 2 mg 1 po Q6 hrs prn.   2   Calculus Ureter - N20.1 Left, Worsening - Culture urine. Will empirically begin Cephalexin 500 mg 1 po BID X 7 days till culture complete. No change in position of left ureteral calculus. Still at L2. Recommend she proceed with ESWL as discussed in Nov 2017 w/Dr. Retta Diones. Tamsulosin 0.4 mg 1 po daily   3 NON-GU:   Nausea - R11.0 Acute - Promethazine 25 mg IM. Promethazine 25 mg 1 po Q6 hrs prn   PLAN:            Medications New Meds: Tamsulosin Hcl 0.4 mg  capsule, ext release 24 hr 1 capsule PO Daily   #30  1 Refill(s)  Cephalexin 500 mg tablet 1 tablet PO BID   #14  0 Refill(s)  Hydromorphone Hcl 2 mg tablet 1 tablet PO Q 6 H PRN   #15  0 Refill(s)  Promethazine Hcl 25 mg tablet 1 tablet PO Q 6 H PRN   #15  0 Refill(s)            Orders Labs Urine Culture and Sensitivity  X-Rays: KUB          Schedule Procedure: 03/10/2016 at [facility] - [phone] -  Phenergan 25mg  (Phenergan Per 50 Mg) - [id number], [id number]  Procedure: 03/10/2016 at [facility] - [phone] - Ketoralac 60mg  (Toradol Per 15 Mg) - [id number], [phone]

## 2016-05-02 ENCOUNTER — Emergency Department (HOSPITAL_COMMUNITY): Payer: Self-pay

## 2016-05-02 ENCOUNTER — Emergency Department (HOSPITAL_COMMUNITY)
Admission: EM | Admit: 2016-05-02 | Discharge: 2016-05-02 | Disposition: A | Discharge status: Home / Self Care | Payer: Self-pay | Attending: Emergency Medicine | Admitting: Emergency Medicine

## 2016-05-02 ENCOUNTER — Encounter (HOSPITAL_COMMUNITY): Payer: Self-pay | Admitting: Oncology

## 2016-05-02 DIAGNOSIS — N132 Hydronephrosis with renal and ureteral calculous obstruction: Secondary | ICD-10-CM | POA: Insufficient documentation

## 2016-05-02 DIAGNOSIS — N201 Calculus of ureter: Secondary | ICD-10-CM

## 2016-05-02 LAB — BASIC METABOLIC PANEL
Anion gap: 8 (ref 5–15)
BUN: 11 mg/dL (ref 6–20)
CO2: 24 mmol/L (ref 22–32)
Calcium: 9.2 mg/dL (ref 8.9–10.3)
Chloride: 105 mmol/L (ref 101–111)
Creatinine, Ser: 0.83 mg/dL (ref 0.44–1.00)
GFR calc non Af Amer: >60 mL/min (ref >60)
Glucose, Bld: 123 mg/dL — ABNORMAL HIGH (ref 65–99)
Potassium: 3.8 mmol/L (ref 3.5–5.1)
Sodium: 137 mmol/L (ref 135–145)

## 2016-05-02 LAB — CBC WITH DIFFERENTIAL/PLATELET
Basophils Absolute: 0 10*3/uL (ref 0.0–0.1)
Basophils Relative: 0 %
EOS ABS: 0.2 10*3/uL (ref 0.0–0.7)
Eosinophils Relative: 2 %
HEMATOCRIT: 41.8 % (ref 36.0–46.0)
Hemoglobin: 13.5 g/dL (ref 12.0–15.0)
LYMPHS ABS: 2.2 10*3/uL (ref 0.7–4.0)
Lymphocytes Relative: 17 %
MCH: 28.8 pg (ref 26.0–34.0)
MCHC: 32.3 g/dL (ref 30.0–36.0)
MCV: 89.1 fL (ref 78.0–100.0)
Monocytes Absolute: 0.6 10*3/uL (ref 0.1–1.0)
Monocytes Relative: 5 %
Neutro Abs: 9.6 10*3/uL — ABNORMAL HIGH (ref 1.7–7.7)
Neutrophils Relative %: 76 %
Platelets: 403 10*3/uL — ABNORMAL HIGH (ref 150–400)
RBC: 4.69 MIL/uL (ref 3.87–5.11)
RDW: 13.4 % (ref 11.5–15.5)
WBC: 12.6 10*3/uL — ABNORMAL HIGH (ref 4.0–10.5)

## 2016-05-02 LAB — I-STAT BETA HCG BLOOD, ED (MC, WL, AP ONLY): I-stat hCG, quantitative: <5 m[IU]/mL (ref <5)

## 2016-05-02 MED ORDER — OXYCODONE-ACETAMINOPHEN 5-325 MG PO TABS: 1.0000 | ORAL_TABLET | ORAL | 0 refills | Status: DC | PRN

## 2016-05-02 MED ORDER — HYDROMORPHONE HCL 2 MG/ML IJ SOLN
1.0000 mg | Freq: Once | INTRAMUSCULAR | Status: AC
  Administered 2016-05-02: 1 mg via INTRAVENOUS
  Filled 2016-05-02: qty 1

## 2016-05-02 MED ORDER — TAMSULOSIN HCL 0.4 MG PO CAPS: 0.4000 mg | ORAL_CAPSULE | Freq: Every day | ORAL | 0 refills | Status: DC

## 2016-05-02 MED ORDER — KETOROLAC TROMETHAMINE 30 MG/ML IJ SOLN
30.0000 mg | Freq: Once | INTRAMUSCULAR | Status: AC
  Administered 2016-05-02: 30 mg via INTRAVENOUS
  Filled 2016-05-02: qty 1

## 2016-05-02 MED ORDER — ONDANSETRON HCL 4 MG PO TABS: 4.0000 mg | ORAL_TABLET | Freq: Four times a day (QID) | ORAL | 0 refills | Status: DC | PRN

## 2016-05-02 MED ORDER — ONDANSETRON HCL 4 MG/2ML IJ SOLN
4.0000 mg | Freq: Once | INTRAMUSCULAR | Status: AC
  Administered 2016-05-02: 4 mg via INTRAVENOUS
  Filled 2016-05-02: qty 2

## 2016-05-02 NOTE — Discharge Instructions (Signed)
Return if pain is not being adequately controlled, or if you start running a fever. 

## 2016-05-02 NOTE — ED Triage Notes (Signed)
Pt c/o left sided flank pain that started at 0000.  +nausea/vomiting.  Pt rates pain 10/10, sharp in nature.  Hx of kidney stones

## 2016-05-02 NOTE — ED Provider Notes (Signed)
[facility]-EMERGENCY DEPT Provider Note   CSN: [id number] Arrival date & time: 05/02/16  0407     History   Chief Complaint Chief Complaint  Patient presents with  . Flank Pain    HPI Melody Taylor is a 41 y.o. female.  She had onset about 1 AM of severe left flank pain radiating to the left lower quadrant. Pain is similar to what she has had with kidney stones in the past. She rates pain at 10/10. Nothing makes it better nothing makes it worse. There has been associated nausea and vomiting. She denies fever or chills or sweats. She did have lithotripsy done in January, but was told that there were still residual stones left kidney. She took a dose of the painkiller she had following the lithotripsy, but it is not helped at all.   The history is provided by the patient.  Flank Pain     Past Medical History:  Diagnosis Date  . Gallstones   . History of kidney stones   . Mild acid reflux 04/2015  . PONV (postoperative nausea and vomiting)     Patient Active Problem List   Diagnosis Date Noted  . Pain in joint, lower leg 03/05/2015  . Allergic rhinitis 05/02/2014  . GERD (gastroesophageal reflux disease) 05/02/2014  . Routine general medical examination at a health care facility 05/02/2014  . Morbid obesity (HCC) 05/02/2014    Past Surgical History:  Procedure Laterality Date  . ABLATION    . CHOLECYSTECTOMY    . DILATION AND CURETTAGE OF UTERUS    . LITHOTRIPSY    . TUBAL LIGATION      OB History    No data available       Home Medications    Prior to Admission medications   Medication Sig Start Date End Date Taking? Authorizing Provider  cephALEXin (KEFLEX) 500 MG capsule Take 500 mg by mouth 2 (two) times daily.    Historical Provider, MD  FLUoxetine (PROZAC) 20 MG capsule Take 20 mg by mouth daily as needed (for anxiety).    Historical Provider, MD  HYDROmorphone (DILAUDID) 2 MG tablet Take 1 tablet (2 mg total) by mouth every 6 (six) hours as needed for  severe pain. 03/14/16   Bjorn PippinJohn Wrenn, MD  promethazine (PHENERGAN) 25 MG tablet Take 25 mg by mouth every 6 (six) hours as needed for nausea or vomiting.    Historical Provider, MD  tamsulosin (FLOMAX) 0.4 MG CAPS capsule Take 0.4 mg by mouth daily as needed (for stones).  02/05/15   Historical Provider, MD    Family History Family History  Problem Relation Age of Onset  . Diabetes Maternal Aunt   . Heart disease Maternal Grandfather   . Heart disease Maternal Uncle     x 2  . Irritable bowel syndrome Mother   . Thyroid disease Mother   . Asthma Mother   . Cancer Maternal Aunt     kidney    Social History Social History  Substance Use Topics  . Smoking status: Never Smoker  . Smokeless tobacco: Never Used  . Alcohol use No     Allergies   Patient has no known allergies.   Review of Systems Review of Systems  Genitourinary: Positive for flank pain.  All other systems reviewed and are negative.    Physical Exam Updated Vital Signs BP 150/92 (BP Location: Right Arm)   Pulse 102   Resp 20   Ht 5' (1.524 m)   Wt 235  lb (106.6 kg)   LMP 05/01/2016 (Exact Date)   SpO2 99%   BMI 45.90 kg/m   Physical Exam  Nursing note and vitals reviewed.  41 year old female, who appears uncomfortable, but is in no acute distress. Vital signs are Significant for hypertension and borderline tachycardia. Oxygen saturation is 99%, which is normal. Head is normocephalic and atraumatic. PERRLA, EOMI. Oropharynx is clear. Neck is nontender and supple without adenopathy or JVD. Back is nontender in the midline. There is mild left CVA tenderness. Lungs are clear without rales, wheezes, or rhonchi. Chest is nontender. Heart has regular rate and rhythm without murmur. Abdomen is soft, flat, with mild left lower quadrant tenderness. There is no rebound or guarding. There are no masses or hepatosplenomegaly and peristalsis is hypactive. Extremities have no cyanosis or edema, full range of  motion is present. Skin is warm and dry without rash. Neurologic: Mental status is normal, cranial nerves are intact, there are no motor or sensory deficits.  ED Treatments / Results  Labs (all labs ordered are listed, but only abnormal results are displayed) Labs Reviewed  BASIC METABOLIC PANEL  CBC WITH DIFFERENTIAL/PLATELET  URINALYSIS, ROUTINE W REFLEX MICROSCOPIC    EKG  EKG Interpretation None       Radiology No results found.  Procedures Procedures (including critical care time)  Medications Ordered in ED Medications  ondansetron (ZOFRAN) injection 4 mg (not administered)  ketorolac (TORADOL) 30 MG/ML injection 30 mg (not administered)  HYDROmorphone (DILAUDID) injection 1 mg (not administered)     Initial Impression / Assessment and Plan / ED Course  I have reviewed the triage vital signs and the nursing notes.  Pertinent labs & imaging results that were available during my care of the patient were reviewed by me and considered in my medical decision making (see chart for details).  Flank pain consistent with renal colic. Old records reviewed confirming recent deficiency, but also with known residual calculi in the left kidney. She'll be given hydromorphone, ondansetron, and will be sent for renal stone protocol CT scan.`  CT scan shows 2 proximal ureteral calculi on the left. There are also 2 intrarenal calculi on the left and 1 intrarenal calculus on the right. She had good relief of pain with above noted treatment. She is discharged with prescriptions for tamsulosin, oxycodone-acetaminophen, and ondansetron. Return precautions discussed.  Final Clinical Impressions(s) / ED Diagnoses   Final diagnoses:  Ureterolithiasis    New Prescriptions New Prescriptions   No medications on file     Dione Booze, MD 05/02/16 [phone]

## 2016-05-24 ENCOUNTER — Encounter (HOSPITAL_COMMUNITY): Payer: Self-pay | Admitting: Urology

## 2016-08-22 ENCOUNTER — Other Ambulatory Visit: Payer: Self-pay | Admitting: Obstetrics and Gynecology

## 2016-08-22 DIAGNOSIS — N63 Unspecified lump in unspecified breast: Secondary | ICD-10-CM

## 2016-10-03 ENCOUNTER — Ambulatory Visit: Admission: RE | Admit: 2016-10-03 | Payer: No Typology Code available for payment source | Source: Ambulatory Visit

## 2016-10-03 ENCOUNTER — Ambulatory Visit
Admission: RE | Admit: 2016-10-03 | Discharge: 2016-10-03 | Disposition: A | Discharge status: Home / Self Care | Payer: No Typology Code available for payment source | Source: Ambulatory Visit | Attending: Obstetrics and Gynecology | Admitting: Obstetrics and Gynecology

## 2016-10-03 DIAGNOSIS — N63 Unspecified lump in unspecified breast: Secondary | ICD-10-CM

## 2016-12-10 IMAGING — MG MM DIAG BREAST TOMO BILATERAL
8 of 14 series · 8 of 40 positions shown · non-contrast
Comparison: Previous exam(s).

CLINICAL DATA: 38-year-old female presenting with palpable area of
concern in the upper outer quadrant of the right breast. Screening
of the left breast was performed concurrently.

EXAM:
DIGITAL DIAGNOSTIC BILATERAL MAMMOGRAM WITH 3D TOMOSYNTHESIS AND CAD
BILATERAL BREAST ULTRASOUND

[R TAN]
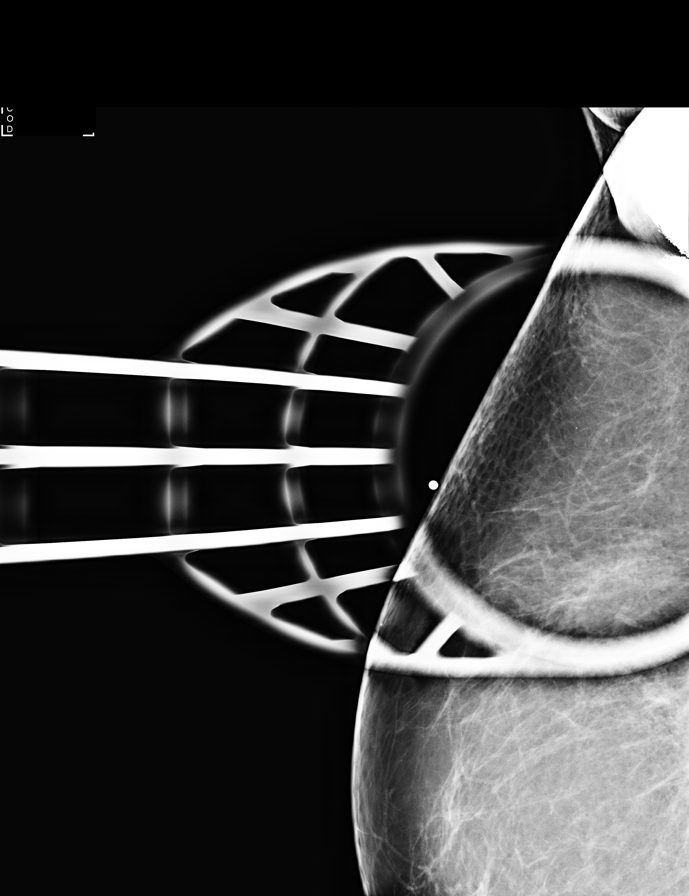

[L CC (1 of 2)]
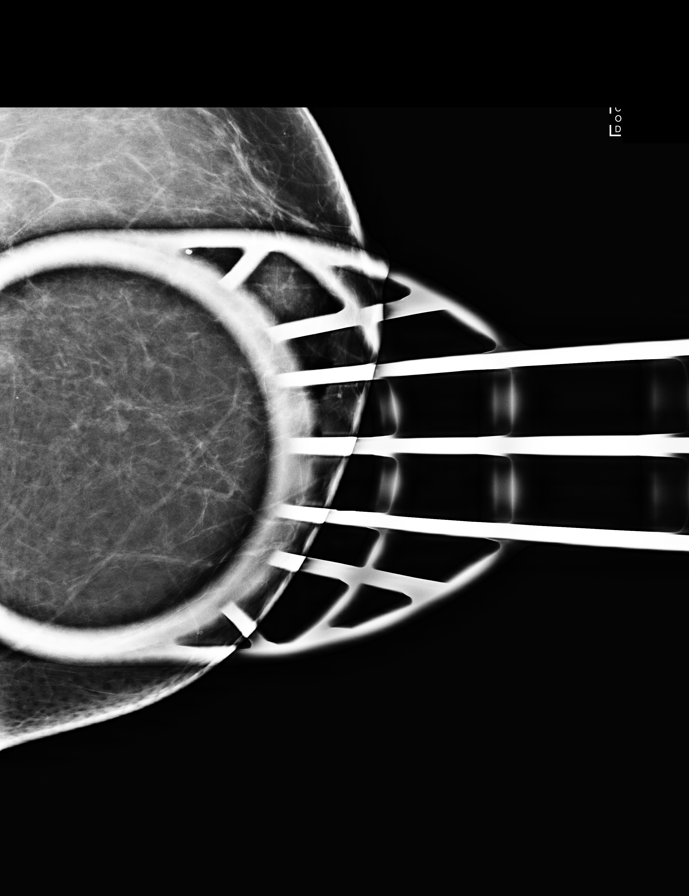

[L CC (2 of 2)]
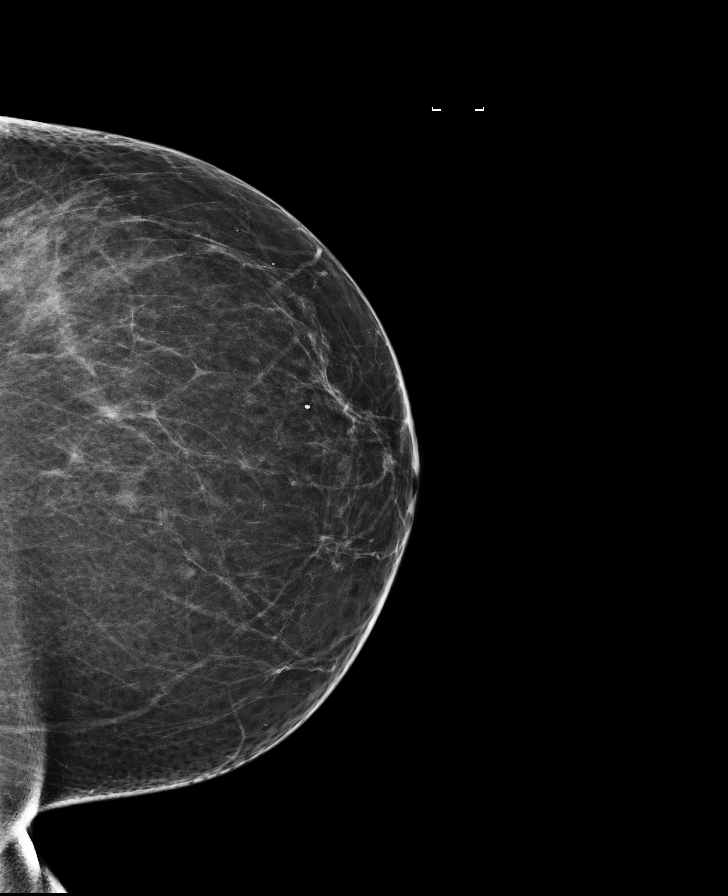

[R MLO]
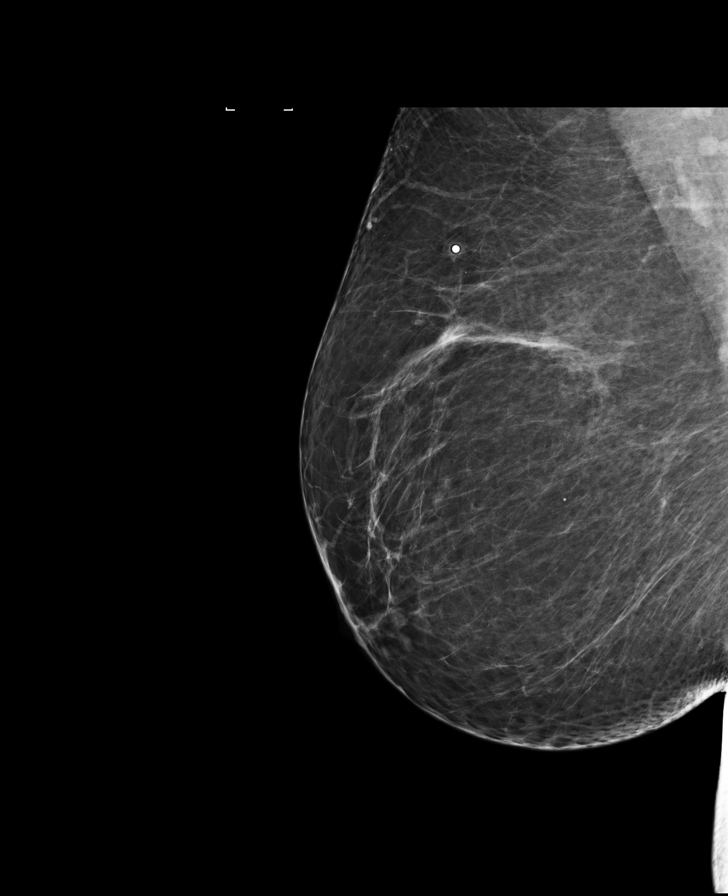

[L ML]
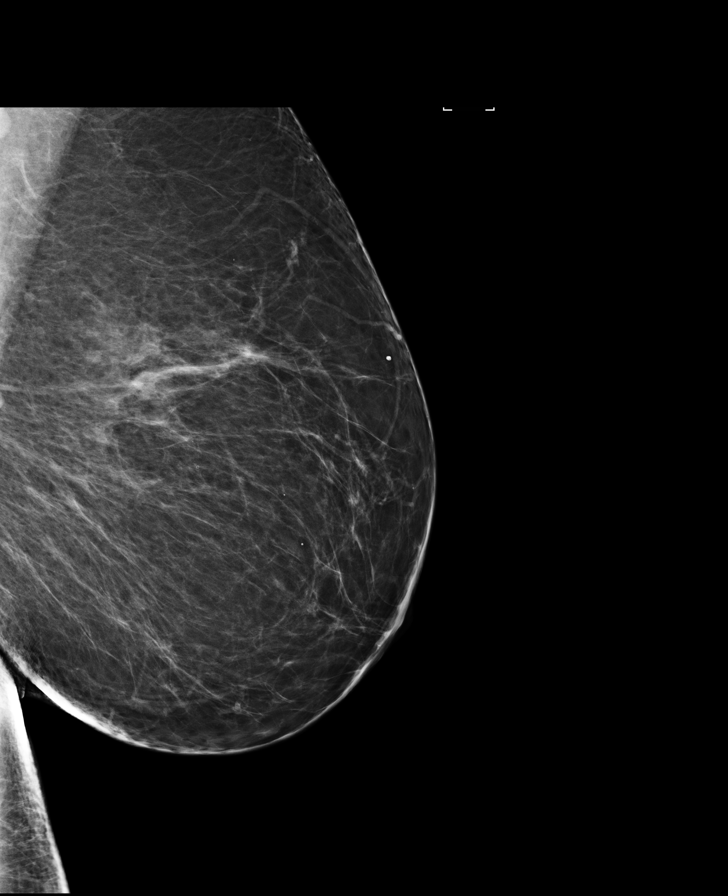

[L MLO]
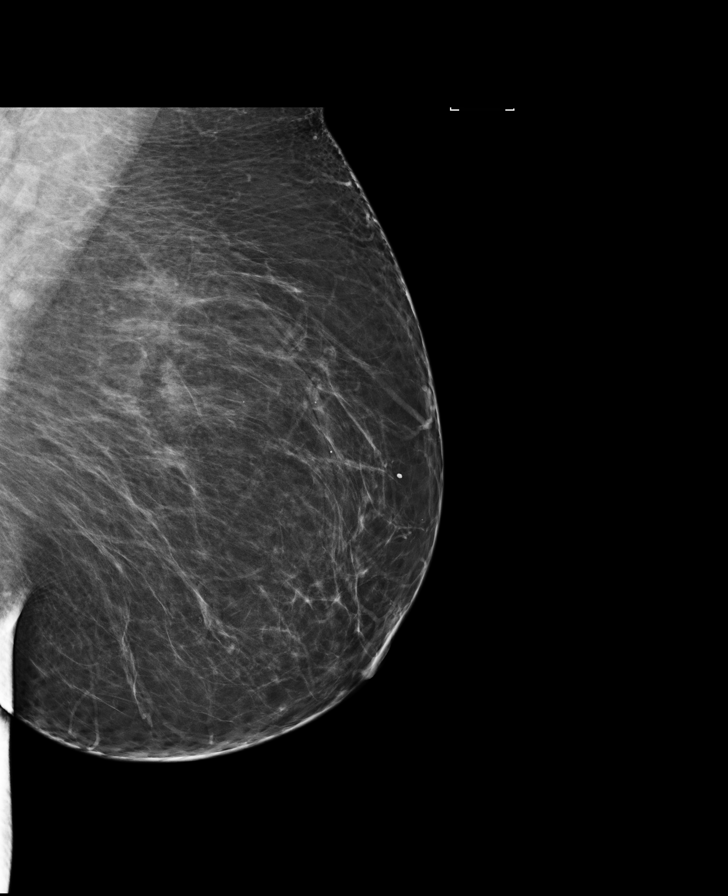

[R CC]
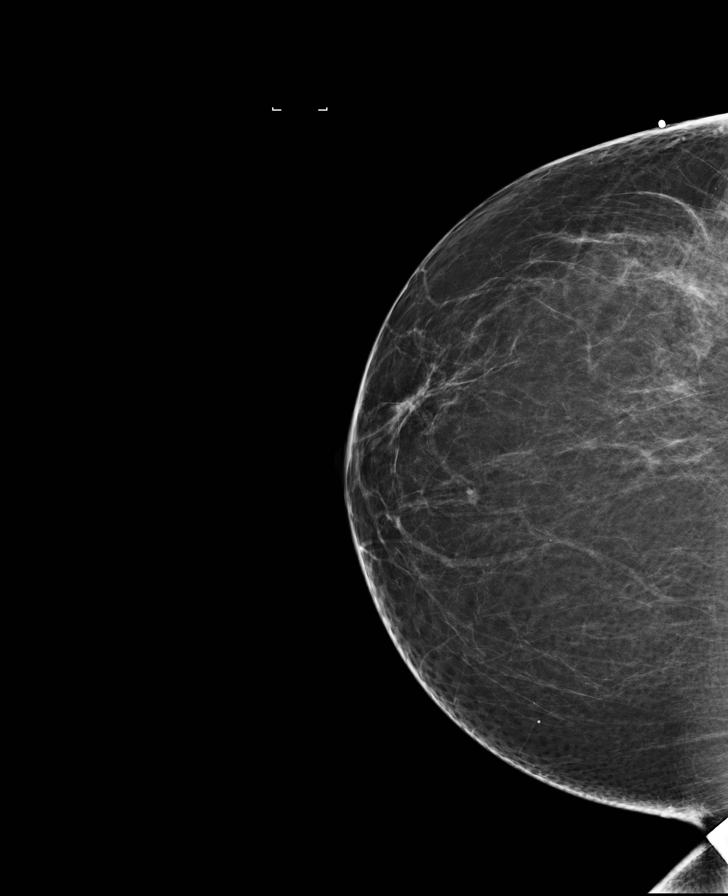

[L MLO tomo · tomo slice 53/106.0]
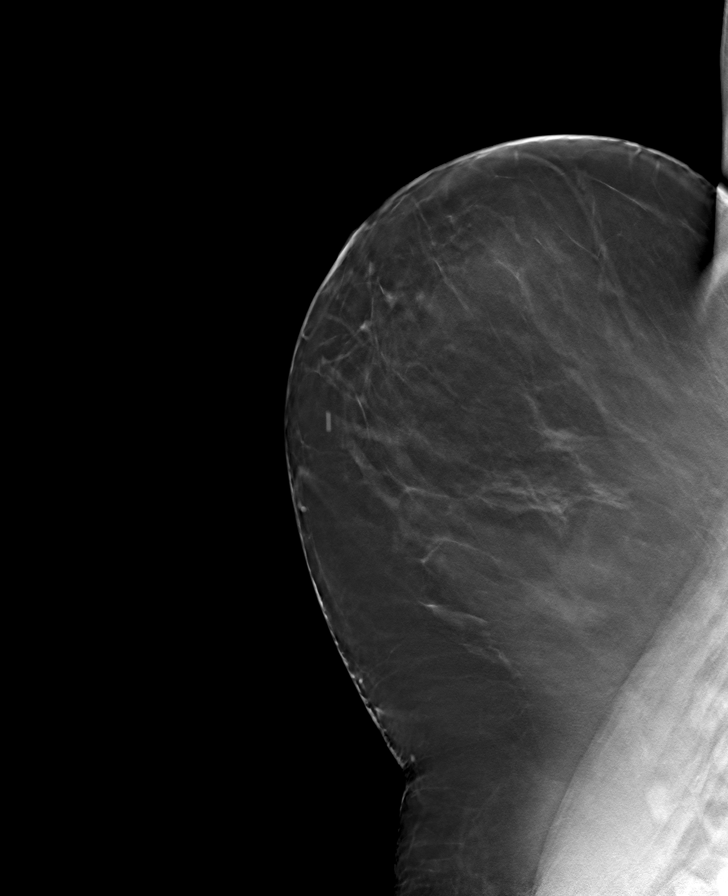

[8 of 40 positions shown; findings below may reference images not displayed]

ACR Breast Density Category b: There are scattered areas of
fibroglandular density.
FINDINGS: No suspicious mammographic findings are seen at the location of the
palpable area of concern in the upper outer quadrant of the right
breast. An asymmetry seen in the medial, slightly inferior left
breast on the tomosynthesis CC view.

Mammographic images were processed with CAD.

On physical exam, normal tissue was palpated at the site of concern
in the upper outer quadrant (axillary tail). No discrete mass was
palpated.

Physical exam of the lower inner quadrant of the left breast
demonstrates normal tissue. No discrete mass was palpated.

Ultrasound targeted to the area of palpable concern in the upper
outer quadrant of the right breast demonstrates normal
fibroglandular tissue. No suspicious masses or areas of shadowing
are identified.

Ultrasound targeted to the lower inner quadrant of the left breast
demonstrates a hypoechoic circumscribed mass measuring 7 x 4 x 2 mm
at the 7 o'clock location.
IMPRESSION: 1. No mammographic or sonographic correlate for the area of palpable
concern in the upper outer quadrant of the right breast. Only normal
tissue is visualized.

2. The 7 mm asymmetry in the lower inner quadrant of the left breast
corresponds to what is likely a probably benign cyst/cluster of
cysts at the 7 o'clock location.

RECOMMENDATION:
1. No mammographic or sonographic correlate for the area of palpable
concern in the upper outer quadrant of the right breast. Any further
workup for this should be based on clinical grounds.

2. A six-month follow-up mammogram and ultrasound is recommended for
the probably benign hypoechoic mass at 7 o'clock in the left breast.

I have discussed the findings and recommendations with the patient.
Results were also provided in writing at the conclusion of the
visit. If applicable, a reminder letter will be sent to the patient
regarding the next appointment.

BI-RADS CATEGORY  3: Probably benign.

## 2017-09-20 ENCOUNTER — Other Ambulatory Visit: Payer: Self-pay | Admitting: Obstetrics and Gynecology

## 2017-09-20 DIAGNOSIS — Z1231 Encounter for screening mammogram for malignant neoplasm of breast: Secondary | ICD-10-CM

## 2017-10-11 ENCOUNTER — Ambulatory Visit
Admission: RE | Admit: 2017-10-11 | Discharge: 2017-10-11 | Disposition: A | Discharge status: Home / Self Care | Payer: No Typology Code available for payment source | Source: Ambulatory Visit | Attending: Obstetrics and Gynecology | Admitting: Obstetrics and Gynecology

## 2017-10-11 DIAGNOSIS — Z1231 Encounter for screening mammogram for malignant neoplasm of breast: Secondary | ICD-10-CM

## 2018-07-12 ENCOUNTER — Other Ambulatory Visit: Payer: Self-pay | Admitting: Obstetrics and Gynecology

## 2018-07-12 DIAGNOSIS — N644 Mastodynia: Secondary | ICD-10-CM

## 2018-07-16 ENCOUNTER — Ambulatory Visit: Payer: No Typology Code available for payment source

## 2018-07-16 ENCOUNTER — Other Ambulatory Visit: Payer: Self-pay

## 2018-07-16 ENCOUNTER — Ambulatory Visit
Admission: RE | Admit: 2018-07-16 | Discharge: 2018-07-16 | Disposition: A | Discharge status: Home / Self Care | Payer: No Typology Code available for payment source | Source: Ambulatory Visit | Attending: Obstetrics and Gynecology | Admitting: Obstetrics and Gynecology

## 2018-07-16 DIAGNOSIS — N644 Mastodynia: Secondary | ICD-10-CM

## 2019-03-29 ENCOUNTER — Inpatient Hospital Stay (HOSPITAL_COMMUNITY)
Admission: EM | Admit: 2019-03-29 | Discharge: 2019-04-01 | DRG: 177 | Disposition: A | Discharge status: Home / Self Care | Payer: HRSA Program | Source: Ambulatory Visit | Attending: Internal Medicine | Admitting: Internal Medicine

## 2019-03-29 ENCOUNTER — Emergency Department (HOSPITAL_COMMUNITY): Payer: HRSA Program

## 2019-03-29 DIAGNOSIS — Z6841 Body Mass Index (BMI) 40.0 and over, adult: Secondary | ICD-10-CM

## 2019-03-29 DIAGNOSIS — J9600 Acute respiratory failure, unspecified whether with hypoxia or hypercapnia: Secondary | ICD-10-CM | POA: Diagnosis not present

## 2019-03-29 DIAGNOSIS — U071 COVID-19: Secondary | ICD-10-CM | POA: Diagnosis present

## 2019-03-29 DIAGNOSIS — E876 Hypokalemia: Secondary | ICD-10-CM | POA: Diagnosis present

## 2019-03-29 DIAGNOSIS — Z87442 Personal history of urinary calculi: Secondary | ICD-10-CM

## 2019-03-29 DIAGNOSIS — Z79899 Other long term (current) drug therapy: Secondary | ICD-10-CM

## 2019-03-29 DIAGNOSIS — T380X5A Adverse effect of glucocorticoids and synthetic analogues, initial encounter: Secondary | ICD-10-CM | POA: Diagnosis present

## 2019-03-29 DIAGNOSIS — A0839 Other viral enteritis: Secondary | ICD-10-CM | POA: Diagnosis present

## 2019-03-29 DIAGNOSIS — D72829 Elevated white blood cell count, unspecified: Secondary | ICD-10-CM | POA: Diagnosis present

## 2019-03-29 DIAGNOSIS — J1282 Pneumonia due to coronavirus disease 2019: Secondary | ICD-10-CM | POA: Diagnosis present

## 2019-03-29 DIAGNOSIS — J9601 Acute respiratory failure with hypoxia: Secondary | ICD-10-CM | POA: Diagnosis present

## 2019-03-29 LAB — PROCALCITONIN: Procalcitonin: <0.1 ng/mL

## 2019-03-29 LAB — D-DIMER, QUANTITATIVE: D-Dimer, Quant: 0.58 ug/mL-FEU — ABNORMAL HIGH (ref 0.00–0.50)

## 2019-03-29 LAB — COMPREHENSIVE METABOLIC PANEL
ALT: 48 U/L — ABNORMAL HIGH (ref 0–44)
AST: 21 U/L (ref 15–41)
Albumin: 3.1 g/dL — ABNORMAL LOW (ref 3.5–5.0)
Alkaline Phosphatase: 69 U/L (ref 38–126)
Anion gap: 11 (ref 5–15)
BUN: 8 mg/dL (ref 6–20)
CO2: 22 mmol/L (ref 22–32)
Calcium: 9 mg/dL (ref 8.9–10.3)
Chloride: 105 mmol/L (ref 98–111)
Creatinine, Ser: 0.64 mg/dL (ref 0.44–1.00)
GFR calc Af Amer: >60 mL/min (ref >60)
GFR calc non Af Amer: >60 mL/min (ref >60)
Glucose, Bld: 104 mg/dL — ABNORMAL HIGH (ref 70–99)
Potassium: 3.4 mmol/L — ABNORMAL LOW (ref 3.5–5.1)
Sodium: 138 mmol/L (ref 135–145)
Total Bilirubin: 0.3 mg/dL (ref 0.3–1.2)
Total Protein: 6.7 g/dL (ref 6.5–8.1)

## 2019-03-29 LAB — TROPONIN I (HIGH SENSITIVITY)
Troponin I (High Sensitivity): 4 ng/L (ref <18)
Troponin I (High Sensitivity): 5 ng/L (ref <18)

## 2019-03-29 LAB — CBC WITH DIFFERENTIAL/PLATELET
Abs Immature Granulocytes: 0.07 10*3/uL (ref 0.00–0.07)
Basophils Absolute: 0 10*3/uL (ref 0.0–0.1)
Basophils Relative: 0 %
Eosinophils Absolute: 0 10*3/uL (ref 0.0–0.5)
Eosinophils Relative: 0 %
HCT: 41 % (ref 36.0–46.0)
Hemoglobin: 12.8 g/dL (ref 12.0–15.0)
Immature Granulocytes: 1 %
Lymphocytes Relative: 9 %
Lymphs Abs: 1.1 10*3/uL (ref 0.7–4.0)
MCH: 28.9 pg (ref 26.0–34.0)
MCHC: 31.2 g/dL (ref 30.0–36.0)
MCV: 92.6 fL (ref 80.0–100.0)
Monocytes Absolute: 0.3 10*3/uL (ref 0.1–1.0)
Monocytes Relative: 2 %
Neutro Abs: 10.5 10*3/uL — ABNORMAL HIGH (ref 1.7–7.7)
Neutrophils Relative %: 88 %
Platelets: 288 10*3/uL (ref 150–400)
RBC: 4.43 MIL/uL (ref 3.87–5.11)
RDW: 14 % (ref 11.5–15.5)
WBC: 11.9 10*3/uL — ABNORMAL HIGH (ref 4.0–10.5)
nRBC: 0 % (ref 0.0–0.2)

## 2019-03-29 LAB — TRIGLYCERIDES: Triglycerides: 136 mg/dL (ref <150)

## 2019-03-29 LAB — ABO/RH: ABO/RH(D): O POS

## 2019-03-29 LAB — BRAIN NATRIURETIC PEPTIDE: B Natriuretic Peptide: 40.6 pg/mL (ref 0.0–100.0)

## 2019-03-29 LAB — I-STAT BETA HCG BLOOD, ED (MC, WL, AP ONLY): I-stat hCG, quantitative: <5 m[IU]/mL (ref <5)

## 2019-03-29 LAB — C-REACTIVE PROTEIN: CRP: 13.9 mg/dL — ABNORMAL HIGH (ref <1.0)

## 2019-03-29 LAB — LACTIC ACID, PLASMA: Lactic Acid, Venous: 1.6 mmol/L (ref 0.5–1.9)

## 2019-03-29 LAB — FERRITIN: Ferritin: 599 ng/mL — ABNORMAL HIGH (ref 11–307)

## 2019-03-29 LAB — LACTATE DEHYDROGENASE: LDH: 238 U/L — ABNORMAL HIGH (ref 98–192)

## 2019-03-29 LAB — FIBRINOGEN: Fibrinogen: 707 mg/dL — ABNORMAL HIGH (ref 210–475)

## 2019-03-29 LAB — HIV ANTIBODY (ROUTINE TESTING W REFLEX): HIV Screen 4th Generation wRfx: NONREACTIVE

## 2019-03-29 MED ORDER — HYDROMORPHONE HCL 2 MG PO TABS: 2.0000 mg | ORAL_TABLET | Freq: Four times a day (QID) | ORAL | Status: DC | PRN

## 2019-03-29 MED ORDER — ACETAMINOPHEN 500 MG PO TABS: 1000.0000 mg | ORAL_TABLET | Freq: Four times a day (QID) | ORAL | Status: DC | PRN

## 2019-03-29 MED ORDER — ZINC SULFATE 220 (50 ZN) MG PO CAPS
220.0000 mg | ORAL_CAPSULE | Freq: Every day | ORAL | Status: DC
  Administered 2019-03-29 – 2019-04-01 (×4): 220 mg via ORAL
  Filled 2019-03-29 (×4): qty 1

## 2019-03-29 MED ORDER — IBUPROFEN 800 MG PO TABS
800.0000 mg | ORAL_TABLET | Freq: Once | ORAL | Status: AC
  Administered 2019-03-29: 800 mg via ORAL
  Filled 2019-03-29: qty 1

## 2019-03-29 MED ORDER — POTASSIUM CHLORIDE CRYS ER 20 MEQ PO TBCR
40.0000 meq | EXTENDED_RELEASE_TABLET | Freq: Once | ORAL | Status: AC
  Administered 2019-03-29: 40 meq via ORAL
  Filled 2019-03-29: qty 2

## 2019-03-29 MED ORDER — ASCORBIC ACID 500 MG PO TABS
500.0000 mg | ORAL_TABLET | Freq: Every day | ORAL | Status: DC
  Administered 2019-03-29 – 2019-04-01 (×4): 500 mg via ORAL
  Filled 2019-03-29 (×4): qty 1

## 2019-03-29 MED ORDER — DEXAMETHASONE SODIUM PHOSPHATE 10 MG/ML IJ SOLN
10.0000 mg | Freq: Once | INTRAMUSCULAR | Status: AC
Start: 2019-03-29 — End: 2019-03-29
  Administered 2019-03-29: 10 mg via INTRAVENOUS
  Filled 2019-03-29: qty 1

## 2019-03-29 MED ORDER — ENOXAPARIN SODIUM 60 MG/0.6ML ~~LOC~~ SOLN
0.5000 mg/kg | SUBCUTANEOUS | Status: DC
  Administered 2019-03-29 – 2019-03-31 (×3): 50 mg via SUBCUTANEOUS
  Filled 2019-03-29 (×2): qty 0.6
  Filled 2019-03-29: qty 0.5

## 2019-03-29 MED ORDER — GUAIFENESIN-DM 100-10 MG/5ML PO SYRP: 10.0000 mL | ORAL_SOLUTION | ORAL | Status: DC | PRN

## 2019-03-29 MED ORDER — ALBUTEROL SULFATE HFA 108 (90 BASE) MCG/ACT IN AERS
2.0000 | INHALATION_SPRAY | Freq: Four times a day (QID) | RESPIRATORY_TRACT | Status: DC
  Administered 2019-03-29 – 2019-03-30 (×4): 2 via RESPIRATORY_TRACT
  Filled 2019-03-29: qty 6.7

## 2019-03-29 MED ORDER — OXYCODONE-ACETAMINOPHEN 5-325 MG PO TABS: 1.0000 | ORAL_TABLET | ORAL | Status: DC | PRN

## 2019-03-29 MED ORDER — ALBUTEROL SULFATE HFA 108 (90 BASE) MCG/ACT IN AERS
2.0000 | INHALATION_SPRAY | Freq: Once | RESPIRATORY_TRACT | Status: AC
  Administered 2019-03-29: 13:00:00 2 via RESPIRATORY_TRACT
  Filled 2019-03-29: qty 6.7

## 2019-03-29 MED ORDER — SODIUM CHLORIDE 0.9 % IV SOLN
100.0000 mg | Freq: Every day | INTRAVENOUS | Status: DC
  Administered 2019-03-30 – 2019-04-01 (×3): 100 mg via INTRAVENOUS
  Filled 2019-03-29 (×4): qty 20

## 2019-03-29 MED ORDER — FLUOXETINE HCL 20 MG PO CAPS
20.0000 mg | ORAL_CAPSULE | Freq: Every day | ORAL | Status: DC | PRN
  Filled 2019-03-29: qty 1

## 2019-03-29 MED ORDER — ONDANSETRON HCL 4 MG PO TABS: 4.0000 mg | ORAL_TABLET | Freq: Four times a day (QID) | ORAL | Status: DC | PRN

## 2019-03-29 MED ORDER — SODIUM CHLORIDE 0.9 % IV SOLN
200.0000 mg | Freq: Once | INTRAVENOUS | Status: AC
  Administered 2019-03-29: 200 mg via INTRAVENOUS
  Filled 2019-03-29: qty 200

## 2019-03-29 MED ORDER — PROMETHAZINE HCL 25 MG PO TABS
25.0000 mg | ORAL_TABLET | Freq: Four times a day (QID) | ORAL | Status: DC | PRN
  Administered 2019-03-31: 25 mg via ORAL
  Filled 2019-03-29: qty 1

## 2019-03-29 MED ORDER — TAMSULOSIN HCL 0.4 MG PO CAPS: 0.4000 mg | ORAL_CAPSULE | Freq: Every day | ORAL | Status: DC

## 2019-03-29 MED ORDER — AEROCHAMBER PLUS FLO-VU LARGE MISC
1.0000 | Freq: Once | Status: AC
  Administered 2019-03-29: 1

## 2019-03-29 MED ORDER — DEXAMETHASONE 6 MG PO TABS
6.0000 mg | ORAL_TABLET | ORAL | Status: DC
  Administered 2019-03-29: 6 mg via ORAL
  Filled 2019-03-29: qty 2

## 2019-03-29 NOTE — Progress Notes (Signed)
COVID + 03/26/19 - Tested @ [facility]. Results in Epic & hard copy printed and at beside with patient

## 2019-03-29 NOTE — H&P (Signed)
History and Physical    Melody Taylor RWE:[id number] DOB: 1975/12/22 DOA: 03/29/2019  PCP: Marva Panda, NP   Patient coming from:Home I have personally briefly reviewed patient's old medical records in [facility]  Chief Complaint: Cough, SOB  HPI: Melody Taylor is a 44 y.o. female with medical history significant of Kidney stones, morbid obese, presented with worsening of dry cough and short of breath. She was treated by her PCP with multiple different medications for bronchitis for one week, and was diagnosed with coronavirus on Monday.  Over the last 5 days she has been through a combination of treatments including inhalers, steroids, and antibiotic but continues to become more short of breath with increased coughing.  She went to see her PCP today and was found to have hypoxia and they her to ED.  She was noted to be hypoxic with O2 sat in 80s in minimal activity and fever of 102 and tachypnic. She denies nausea vomiting or diarrhea.   ED Course: X-ray shows bilateral pneumonia  Review of Systems: As per HPI otherwise 10 point review of systems negative.    Past Medical History:  Diagnosis Date  . Gallstones   . History of kidney stones   . Mild acid reflux 04/2015  . PONV (postoperative nausea and vomiting)     Past Surgical History:  Procedure Laterality Date  . ABLATION    . CHOLECYSTECTOMY    . DILATION AND CURETTAGE OF UTERUS    . EXTRACORPOREAL SHOCK WAVE LITHOTRIPSY Left 03/14/2016   Procedure: LEFT EXTRACORPOREAL SHOCK WAVE LITHOTRIPSY (ESWL);  Surgeon: Bjorn Pippin, MD;  Location: [facility];  Service: Urology;  Laterality: Left;  . LITHOTRIPSY    . TUBAL LIGATION       reports that she has never smoked. She has never used smokeless tobacco. She reports that she does not drink alcohol or use drugs.  No Known Allergies  Family History  Problem Relation Age of Onset  . Diabetes Maternal Aunt   . Heart disease Maternal Grandfather   . Heart disease  Maternal Uncle        x 2  . Irritable bowel syndrome Mother   . Thyroid disease Mother   . Asthma Mother   . Cancer Maternal Aunt        kidney  . Breast cancer Neg Hx      Prior to Admission medications   Medication Sig Start Date End Date Taking? Authorizing Provider  acetaminophen (TYLENOL) 500 MG tablet Take 1,000 mg by mouth every 6 (six) hours as needed for mild pain.    [provider]  albuterol (VENTOLIN HFA) 108 (90 Base) MCG/ACT inhaler Inhale 2 puffs into the lungs every 4 (four) hours. 03/13/19   [provider]  azithromycin (ZITHROMAX) 250 MG tablet Take 250-500 mg by mouth See admin instructions. Take 500mg  on day 1 then 250mg  daily on days 2-5 03/26/19   [provider]  benzonatate (TESSALON) 100 MG capsule Take 100 mg by mouth 3 (three) times daily. 03/14/19   [provider]  FLUoxetine (PROZAC) 20 MG capsule Take 20 mg by mouth daily as needed (for anxiety).    [provider]  fluticasone (FLONASE) 50 MCG/ACT nasal spray Place 2 sprays into both nostrils daily. 03/02/19   [provider]  HYDROMET 5-1.5 MG/5ML syrup Take 5 mLs by mouth every 4 (four) hours as needed. 03/13/19   [provider]  HYDROmorphone (DILAUDID) 2 MG tablet Take 1 tablet (2  mg total) by mouth every 6 (six) hours as needed for severe pain. Patient not taking: Reported on 03/29/2019 03/14/16   Bjorn Pippin, MD  methylPREDNISolone (MEDROL DOSEPAK) 4 MG TBPK tablet See admin instructions. follow package directions 03/26/19   [provider]  ondansetron (ZOFRAN) 4 MG tablet Take 1 tablet (4 mg total) by mouth every 6 (six) hours as needed for nausea or vomiting. 05/02/16   Dione Booze, MD  ondansetron (ZOFRAN-ODT) 8 MG disintegrating tablet Take 8 mg by mouth every 8 (eight) hours as needed. 03/26/19   [provider]  oxyCODONE-acetaminophen (PERCOCET) 5-325 MG tablet Take 1 tablet by mouth every 4 (four) hours as needed for  moderate pain. Patient not taking: Reported on 03/29/2019 05/02/16   Dione Booze, MD  predniSONE (DELTASONE) 20 MG tablet Take 20 mg by mouth 2 (two) times daily. 03/13/19   [provider]  promethazine (PHENERGAN) 25 MG tablet Take 25 mg by mouth every 6 (six) hours as needed for nausea or vomiting.    [provider]  tamsulosin (FLOMAX) 0.4 MG CAPS capsule Take 1 capsule (0.4 mg total) by mouth daily. Patient not taking: Reported on 03/29/2019 05/02/16   Dione Booze, MD    Physical Exam: Vitals:   03/29/19 1430 03/29/19 1445 03/29/19 1500 03/29/19 1602  BP: (!) 103/56 (!) 106/52 (!) 104/52 109/78  Pulse: (!) 103 99 (!) 101 (!) 104  Resp: (!) 24 (!) 33 (!) 35 (!) 25  Temp:      TempSrc:      SpO2: 92% 93% 94% 94%  Weight:      Height:        Constitutional: NAD, calm, comfortable Vitals:   03/29/19 1430 03/29/19 1445 03/29/19 1500 03/29/19 1602  BP: (!) 103/56 (!) 106/52 (!) 104/52 109/78  Pulse: (!) 103 99 (!) 101 (!) 104  Resp: (!) 24 (!) 33 (!) 35 (!) 25  Temp:      TempSrc:      SpO2: 92% 93% 94% 94%  Weight:      Height:       Eyes: PERRL, lids and conjunctivae normal ENMT: Mucous membranes are moist. Posterior pharynx clear of any exudate or lesions.Normal dentition.  Neck: normal, supple, no masses, no thyromegaly Respiratory: clear to auscultation bilaterally, scattered wheezing, no crackles. Increasing respiratory effort. No accessory muscle use.  Cardiovascular: Regular rate and rhythm, no murmurs / rubs / gallops. No extremity edema. 2+ pedal pulses. No carotid bruits.  Abdomen: no tenderness, no masses palpated. No hepatosplenomegaly. Bowel sounds positive.  Musculoskeletal: no clubbing / cyanosis. No joint deformity upper and lower extremities. Good ROM, no contractures. Normal muscle tone.  Skin: no rashes, lesions, ulcers. No induration Neurologic: CN 2-12 grossly intact. Sensation intact, DTR normal. Strength 5/5 in all 4.  Psychiatric: Normal  judgment and insight. Alert and oriented x 3. Normal mood.    Labs on Admission: I have personally reviewed following labs and imaging studies  CBC: Recent Labs  Lab 03/29/19 1240  WBC 11.9*  NEUTROABS 10.5*  HGB 12.8  HCT 41.0  MCV 92.6  PLT 288   Basic Metabolic Panel: Recent Labs  Lab 03/29/19 1240  NA 138  K 3.4*  CL 105  CO2 22  GLUCOSE 104*  BUN 8  CREATININE 0.64  CALCIUM 9.0   GFR: Estimated Creatinine Clearance: 97.8 mL/min (by C-G formula based on SCr of 0.64 mg/dL). Liver Function Tests: Recent Labs  Lab 03/29/19 1240  AST 21  ALT  48*  ALKPHOS 69  BILITOT 0.3  PROT 6.7  ALBUMIN 3.1*   No results for input(s): LIPASE, AMYLASE in the last 168 hours. No results for input(s): AMMONIA in the last 168 hours. Coagulation Profile: No results for input(s): INR, PROTIME in the last 168 hours. Cardiac Enzymes: No results for input(s): CKTOTAL, CKMB, CKMBINDEX, TROPONINI in the last 168 hours. BNP (last 3 results) No results for input(s): PROBNP in the last 8760 hours. HbA1C: No results for input(s): HGBA1C in the last 72 hours. CBG: No results for input(s): GLUCAP in the last 168 hours. Lipid Profile: Recent Labs    03/29/19 1240  TRIG 136   Thyroid Function Tests: No results for input(s): TSH, T4TOTAL, FREET4, T3FREE, THYROIDAB in the last 72 hours. Anemia Panel: Recent Labs    03/29/19 1240  FERRITIN 599*   Urine analysis:    Component Value Date/Time   COLORURINE RED (A) 01/29/2015 1700   APPEARANCEUR TURBID (A) 01/29/2015 1700   LABSPEC 1.028 01/29/2015 1700   PHURINE 5.5 01/29/2015 1700   GLUCOSEU NEGATIVE 01/29/2015 1700   HGBUR LARGE (A) 01/29/2015 1700   BILIRUBINUR MODERATE (A) 01/29/2015 1700   KETONESUR 15 (A) 01/29/2015 1700   PROTEINUR 100 (A) 01/29/2015 1700   UROBILINOGEN 0.2 02/06/2012 1053   NITRITE NEGATIVE 01/29/2015 1700   LEUKOCYTESUR SMALL (A) 01/29/2015 1700    Radiological Exams on Admission: DG Chest Port 1  View  Result Date: 03/29/2019 CLINICAL DATA:  Cough.  Shortness of breath.  COVID. EXAM: PORTABLE CHEST 1 VIEW COMPARISON:  Chest x-ray 05/08/2015. FINDINGS: Heart size stable. Diffuse bilateral pulmonary infiltrates/edema noted. No pleural effusion or pneumothorax. No acute bony abnormality. IMPRESSION: Diffuse bilateral pulmonary infiltrates/edema. Electronically Signed   By: Marcello Moores  Register   On: 03/29/2019 14:07    EKG: Independently reviewed.   Assessment/Plan Active Problems:   COVID-19 virus infection   COVID-19  (please populate well all problems here in Problem List. (For example, if patient is on BP meds at home and you resume or decide to hold them, it is a problem that needs to be her. Same for CAD, COPD, HLD and so on)  Acute hypoxic respiratory failure 2nd to COVID 19 infection. Confirmation test sent Remdesivir and steroid started in the ED. As needed albuterol, and other breathing meds. Procalcitonin pending, will hold off ABX for now as per xray strongly suggest viral PNA.  Leukocytosis Probably related to steroid use, will hold off ABX for now.  Hypokalemia, will replace.  Renal stones, asymptomatic now.  Morbid obesity, outpatient bariatric evaluation.     DVT prophylaxis: Lovenox Code Status: Full Family Communication: None at bedside Disposition Plan: Home if symptoms improve Consults called: None Admission status: Tele admit   Lequita Halt MD [facility] Pager [phone]  If 7PM-7AM, please contact night-coverage [email or web address] Password [facility]  03/29/2019, 4:37 PM

## 2019-03-29 NOTE — ED Triage Notes (Signed)
Pt to ED from urgent care c/o [facility] pt dx with COVID 1/25 (Monday) and bronchitis. Pt currently taking abx and steroids as prescribed, pt reports no relief and also no relief with Albuterol. Per EMS o2 sats drop with ambulation to the 80s, pt then placed on 3l, at rest pt sats 98%$ on room air. Last VS: 180/96. HR 120, RR 22, No medications given by EMS.

## 2019-03-29 NOTE — ED Provider Notes (Signed)
[facility] EMERGENCY DEPARTMENT Provider Note   CSN: [id number] Arrival date & time: 03/29/19  1230     History Chief Complaint  Patient presents with  . Shortness of Breath    COVID postive    Melody Taylor is a 44 y.o. female.  HPI   This patient is a 44 year old female, she reports that for the last month she is struggled with a combination of both sinus infections and bronchitis.  She has been treated intermittently at the [facility] urgent care and has gone through multiple different medications most recently however being diagnosed with coronavirus on Monday.  Over the last 5 days she has been through a combination of treatments including inhalers, steroids, and antibiotic but continues to become more short of breath with increased coughing.  When she went today to the clinic to be evaluated for what she thought was ongoing bronchitis they redirected her here.  She was noted to be febrile tachycardic hypoxic and in respiratory distress.  The patient states this has been gradually worsening and became severe today.  She denies nausea vomiting or diarrhea.  Past Medical History:  Diagnosis Date  . Gallstones   . History of kidney stones   . Mild acid reflux 04/2015  . PONV (postoperative nausea and vomiting)     Patient Active Problem List   Diagnosis Date Noted  . Pain in joint, lower leg 03/05/2015  . Allergic rhinitis 05/02/2014  . GERD (gastroesophageal reflux disease) 05/02/2014  . Routine general medical examination at a health care facility 05/02/2014  . Morbid obesity (HCC) 05/02/2014    Past Surgical History:  Procedure Laterality Date  . ABLATION    . CHOLECYSTECTOMY    . DILATION AND CURETTAGE OF UTERUS    . EXTRACORPOREAL SHOCK WAVE LITHOTRIPSY Left 03/14/2016   Procedure: LEFT EXTRACORPOREAL SHOCK WAVE LITHOTRIPSY (ESWL);  Surgeon: Bjorn Pippin, MD;  Location: [facility]S;  Service: Urology;  Laterality: Left;  . LITHOTRIPSY    . TUBAL  LIGATION       OB History   No obstetric history on file.     Family History  Problem Relation Age of Onset  . Diabetes Maternal Aunt   . Heart disease Maternal Grandfather   . Heart disease Maternal Uncle        x 2  . Irritable bowel syndrome Mother   . Thyroid disease Mother   . Asthma Mother   . Cancer Maternal Aunt        kidney  . Breast cancer Neg Hx     Social History   Tobacco Use  . Smoking status: Never Smoker  . Smokeless tobacco: Never Used  Substance Use Topics  . Alcohol use: No  . Drug use: No    Home Medications Prior to Admission medications   Medication Sig Start Date End Date Taking? Authorizing Provider  acetaminophen (TYLENOL) 500 MG tablet Take 1,000 mg by mouth every 6 (six) hours as needed for mild pain.    [provider]  FLUoxetine (PROZAC) 20 MG capsule Take 20 mg by mouth daily as needed (for anxiety).    [provider]  HYDROmorphone (DILAUDID) 2 MG tablet Take 1 tablet (2 mg total) by mouth every 6 (six) hours as needed for severe pain. 03/14/16   Bjorn Pippin, MD  ondansetron (ZOFRAN) 4 MG tablet Take 1 tablet (4 mg total) by mouth every 6 (six) hours as needed for nausea or vomiting. 05/02/16   Dione Booze,  MD  oxyCODONE-acetaminophen (PERCOCET) 5-325 MG tablet Take 1 tablet by mouth every 4 (four) hours as needed for moderate pain. 05/02/16   Dione Booze, MD  promethazine (PHENERGAN) 25 MG tablet Take 25 mg by mouth every 6 (six) hours as needed for nausea or vomiting.    [provider]  tamsulosin (FLOMAX) 0.4 MG CAPS capsule Take 0.4 mg by mouth daily as needed (for stones).  02/05/15   [provider]  tamsulosin (FLOMAX) 0.4 MG CAPS capsule Take 1 capsule (0.4 mg total) by mouth daily. 05/02/16   Dione Booze, MD    Allergies    Patient has no known allergies.  Review of Systems   Review of Systems  All other systems reviewed and are negative.   Physical Exam Updated Vital Signs BP 139/71    Pulse (!) 120   Temp (!) 102.4 F (39.1 C) (Oral)   Resp (!) 24   Ht 1.524 m (5')   Wt 104.3 kg   SpO2 95%   BMI 44.92 kg/m   Physical Exam Vitals and nursing note reviewed.  Constitutional:      General: She is in acute distress.     Appearance: She is well-developed. She is ill-appearing.  HENT:     Head: Normocephalic and atraumatic.     Mouth/Throat:     Pharynx: No oropharyngeal exudate.  Eyes:     General: No scleral icterus.       Right eye: No discharge.        Left eye: No discharge.     Conjunctiva/sclera: Conjunctivae normal.     Pupils: Pupils are equal, round, and reactive to light.  Neck:     Thyroid: No thyromegaly.     Vascular: No JVD.  Cardiovascular:     Rate and Rhythm: Regular rhythm. Tachycardia present.     Heart sounds: Normal heart sounds. No murmur. No friction rub. No gallop.      Comments: Pulses are normal at the radial arteries, there is no peripheral edema, the patient has a heart rate of 125 bpm and a sinus tachycardia Pulmonary:     Effort: Respiratory distress present.     Breath sounds: Rales present. No wheezing.     Comments: The patient has increased work of breathing, speaks in shortened sentences, she coughs when she tries to take a deep breath, she has rales at the bases bilaterally, she is tachypneic to 30 breaths/min and hypoxic to 84% with walking, comes up to 92% when resting on room air Abdominal:     General: Bowel sounds are normal. There is no distension.     Palpations: Abdomen is soft. There is no mass.     Tenderness: There is no abdominal tenderness.  Musculoskeletal:        General: No tenderness. Normal range of motion.     Cervical back: Normal range of motion and neck supple.  Lymphadenopathy:     Cervical: No cervical adenopathy.  Skin:    General: Skin is warm and dry.     Findings: No erythema or rash.  Neurological:     Mental Status: She is alert.     Coordination: Coordination normal.  Psychiatric:         Behavior: Behavior normal.     ED Results / Procedures / Treatments   Labs (all labs ordered are listed, but only abnormal results are displayed) Labs Reviewed  CBC WITH DIFFERENTIAL/PLATELET - Abnormal; Notable for the following components:  Result Value   WBC 11.9 (*)    Neutro Abs 10.5 (*)    All other components within normal limits  COMPREHENSIVE METABOLIC PANEL - Abnormal; Notable for the following components:   Potassium 3.4 (*)    Glucose, Bld 104 (*)    Albumin 3.1 (*)    ALT 48 (*)    All other components within normal limits  D-DIMER, QUANTITATIVE (NOT AT [facility] - Abnormal; Notable for the following components:   D-Dimer, Quant 0.58 (*)    All other components within normal limits  LACTATE DEHYDROGENASE - Abnormal; Notable for the following components:   LDH 238 (*)    All other components within normal limits  FERRITIN - Abnormal; Notable for the following components:   Ferritin 599 (*)    All other components within normal limits  FIBRINOGEN - Abnormal; Notable for the following components:   Fibrinogen 707 (*)    All other components within normal limits  C-REACTIVE PROTEIN - Abnormal; Notable for the following components:   CRP 13.9 (*)    All other components within normal limits  CULTURE, BLOOD (ROUTINE X 2)  CULTURE, BLOOD (ROUTINE X 2)  SARS CORONAVIRUS 2 (TAT 6-24 HRS)  LACTIC ACID, PLASMA  PROCALCITONIN  BRAIN NATRIURETIC PEPTIDE  TRIGLYCERIDES  LACTIC ACID, PLASMA  HIV ANTIBODY (ROUTINE TESTING W REFLEX)  CBC WITH DIFFERENTIAL/PLATELET  COMPREHENSIVE METABOLIC PANEL  C-REACTIVE PROTEIN  D-DIMER, QUANTITATIVE (NOT AT [facility])  FERRITIN  MAGNESIUM  PHOSPHORUS  I-STAT BETA HCG BLOOD, ED ([facility], [facility], [facility] ONLY)  ABO/RH  TROPONIN I (HIGH SENSITIVITY)  TROPONIN I (HIGH SENSITIVITY)    EKG EKG Interpretation  Date/Time:  Friday March 29 2019 12:43:50 EST Ventricular Rate:  123 PR Interval:    QRS Duration: 78 QT Interval:  294 QTC  Calculation: 421 R Axis:   55 Text Interpretation: Sinus tachycardia Borderline T abnormalities, inferior leads Since last tracing T wave abnormality NOW PRESENT Confirmed by Noemi Chapel [phone]) on 03/29/2019 1:33:49 PM   Radiology DG Chest Port 1 View  Result Date: 03/29/2019 CLINICAL DATA:  Cough.  Shortness of breath.  COVID. EXAM: PORTABLE CHEST 1 VIEW COMPARISON:  Chest x-ray 05/08/2015. FINDINGS: Heart size stable. Diffuse bilateral pulmonary infiltrates/edema noted. No pleural effusion or pneumothorax. No acute bony abnormality. IMPRESSION: Diffuse bilateral pulmonary infiltrates/edema. Electronically Signed   By: Marcello Moores  Register   On: 03/29/2019 14:07    Procedures .Critical Care Performed by: Noemi Chapel, MD Authorized by: Noemi Chapel, MD   Critical care provider statement:    Critical care time (minutes):  35   Critical care was necessary to treat or prevent imminent or life-threatening deterioration of the following conditions:  Respiratory failure   Critical care was time spent personally by me on the following activities:  Discussions with consultants, evaluation of patient's response to treatment, examination of patient, ordering and performing treatments and interventions, ordering and review of laboratory studies, ordering and review of radiographic studies, pulse oximetry, re-evaluation of patient's condition, obtaining history from patient or surrogate and review of old charts Comments:         (including critical care time)  Medications Ordered in ED Medications  dexamethasone (DECADRON) injection 10 mg (has no administration in time range)  ibuprofen (ADVIL) tablet 800 mg (has no administration in time range)  albuterol (VENTOLIN HFA) 108 (90 Base) MCG/ACT inhaler 2 puff (has no administration in time range)  AeroChamber Plus Flo-Vu Medium MISC 1 each (has no administration in time range)  ED Course  I have reviewed the triage vital signs and the nursing  notes.  Pertinent labs & imaging results that were available during my care of the patient were reviewed by me and considered in my medical decision making (see chart for details).    MDM Rules/Calculators/A&P                       This patient is ill-appearing with what appears to be an acute hypoxic respiratory failure.  I suspect this is related to COVID-19.  She may also very well have other types of pneumonia and with her tachycardia would consider PE as well however she is febrile to 102-1/2.  Ibuprofen has been given, she did take both Tylenol and ibuprofen earlier today at 8:30 AM.  The patient will have blood cultures and a lactic acid, I suspect this is going to be viral in nature.  She will need to be admitted due to hypoxic respiratory failure, steroids and albuterol have been ordered.  12:42 PM Cardiac monitoring reveals sinus tachycardia rate of 125 (Rate & rhythm), as reviewed and interpreted by me. Cardiac monitoring was ordered due to shortness of breath and to monitor patient for dysrhythmia.  CXR reveals the suspected covid PNA - requiring oxygen for acute hypoxic respiratory failure  D/w hospitalist who will admit   Melody Taylor was evaluated in Emergency Department on 03/29/2019 for the symptoms described in the history of present illness. She was evaluated in the context of the global COVID-19 pandemic, which necessitated consideration that the patient might be at risk for infection with the SARS-CoV-2 virus that causes COVID-19. Institutional protocols and algorithms that pertain to the evaluation of patients at risk for COVID-19 are in a state of rapid change based on information released by regulatory bodies including the CDC and federal and state organizations. These policies and algorithms were followed during the patient's care in the ED.   Final Clinical Impression(s) / ED Diagnoses Final diagnoses:  Acute respiratory failure due to COVID-19 [facility])    Rx / DC  Orders ED Discharge Orders    None       Eber Hong, MD 03/29/19 1926

## 2019-03-30 ENCOUNTER — Other Ambulatory Visit: Payer: Self-pay

## 2019-03-30 ENCOUNTER — Encounter (HOSPITAL_COMMUNITY): Payer: Self-pay | Admitting: Internal Medicine

## 2019-03-30 DIAGNOSIS — A0839 Other viral enteritis: Secondary | ICD-10-CM

## 2019-03-30 LAB — C-REACTIVE PROTEIN: CRP: 14.3 mg/dL — ABNORMAL HIGH (ref <1.0)

## 2019-03-30 LAB — COMPREHENSIVE METABOLIC PANEL
ALT: 42 U/L (ref 0–44)
AST: 19 U/L (ref 15–41)
Albumin: 3.4 g/dL — ABNORMAL LOW (ref 3.5–5.0)
Alkaline Phosphatase: 70 U/L (ref 38–126)
Anion gap: 15 (ref 5–15)
BUN: 13 mg/dL (ref 6–20)
CO2: 23 mmol/L (ref 22–32)
Calcium: 8.9 mg/dL (ref 8.9–10.3)
Chloride: 100 mmol/L (ref 98–111)
Creatinine, Ser: 0.68 mg/dL (ref 0.44–1.00)
GFR calc Af Amer: >60 mL/min (ref >60)
GFR calc non Af Amer: >60 mL/min (ref >60)
Glucose, Bld: 134 mg/dL — ABNORMAL HIGH (ref 70–99)
Potassium: 3.5 mmol/L (ref 3.5–5.1)
Sodium: 138 mmol/L (ref 135–145)
Total Bilirubin: 0.6 mg/dL (ref 0.3–1.2)
Total Protein: 7.7 g/dL (ref 6.5–8.1)

## 2019-03-30 LAB — CBC
HCT: 42.2 % (ref 36.0–46.0)
Hemoglobin: 13.2 g/dL (ref 12.0–15.0)
MCH: 28.6 pg (ref 26.0–34.0)
MCHC: 31.3 g/dL (ref 30.0–36.0)
MCV: 91.5 fL (ref 80.0–100.0)
Platelets: 336 10*3/uL (ref 150–400)
RBC: 4.61 MIL/uL (ref 3.87–5.11)
RDW: 14.1 % (ref 11.5–15.5)
WBC: 8 10*3/uL (ref 4.0–10.5)
nRBC: 0 % (ref 0.0–0.2)

## 2019-03-30 LAB — D-DIMER, QUANTITATIVE: D-Dimer, Quant: 0.33 ug/mL-FEU (ref 0.00–0.50)

## 2019-03-30 LAB — SARS CORONAVIRUS 2 (TAT 6-24 HRS): SARS Coronavirus 2: POSITIVE — AB

## 2019-03-30 MED ORDER — OXYCODONE HCL 5 MG PO TABS: 5.0000 mg | ORAL_TABLET | ORAL | Status: DC | PRN

## 2019-03-30 MED ORDER — LOPERAMIDE HCL 2 MG PO CAPS: 2.0000 mg | ORAL_CAPSULE | ORAL | Status: DC | PRN

## 2019-03-30 MED ORDER — DEXAMETHASONE SODIUM PHOSPHATE 10 MG/ML IJ SOLN
6.0000 mg | INTRAMUSCULAR | Status: DC
  Administered 2019-03-30 – 2019-03-31 (×2): 6 mg via INTRAVENOUS
  Filled 2019-03-30 (×2): qty 1

## 2019-03-30 MED ORDER — ACETAMINOPHEN 325 MG PO TABS: 650.0000 mg | ORAL_TABLET | ORAL | Status: DC | PRN

## 2019-03-30 MED ORDER — ALBUTEROL SULFATE HFA 108 (90 BASE) MCG/ACT IN AERS
2.0000 | INHALATION_SPRAY | RESPIRATORY_TRACT | Status: DC | PRN
  Filled 2019-03-30: qty 6.7

## 2019-03-30 MED ORDER — TRAMADOL HCL 50 MG PO TABS: 50.0000 mg | ORAL_TABLET | Freq: Four times a day (QID) | ORAL | Status: DC | PRN

## 2019-03-30 MED ORDER — LOPERAMIDE HCL 2 MG PO CAPS
4.0000 mg | ORAL_CAPSULE | Freq: Once | ORAL | Status: AC
  Administered 2019-03-30: 4 mg via ORAL
  Filled 2019-03-30: qty 2

## 2019-03-30 NOTE — Progress Notes (Signed)
   03/30/19 0933  Family/Significant Other Communication  Family/Significant Other Update Updated (spouse Earvin Hansen)  Patient on phone with spouse Earvin Hansen. Updated given on patients progress and plan of care.

## 2019-03-30 NOTE — Plan of Care (Signed)
  Problem: Education: Goal: Knowledge of risk factors and measures for prevention of condition will improve Outcome: Progressing   Problem: Respiratory: Goal: Will maintain a patent airway Outcome: Progressing   

## 2019-03-30 NOTE — Plan of Care (Signed)
  Problem: Education: Goal: Knowledge of risk factors and measures for prevention of condition will improve Outcome: Progressing   Problem: Coping: Goal: Psychosocial and spiritual needs will be supported Outcome: Progressing   

## 2019-03-30 NOTE — Progress Notes (Addendum)
Patient A&Ox4, independent, VSS, oxygen 3L [place], SpO2>95%. No complaints of shortness of breath or pain.   Patient having few episodes of diarrhea, up to bathroom independently. Patient steady on her feet. CCMD notified patient will be off monitor multiple times for bathroom use.   Call light within reach. Patient to call for assistance if feeling short of breath or dizzy to maintain safety. Will continue to monitor.  1313: Immodium ordered and given to patient to help with diarrhea. Patient using flutter valve and incentive spirometer every hour. Patient has no complaints at this time.   1715:monitoring discontinued. CCMD notified.

## 2019-03-30 NOTE — ED Notes (Signed)
Report given to [facility] RN. All questions answered 

## 2019-03-30 NOTE — Progress Notes (Addendum)
Melody Taylor  PYK:[id number] DOB: February 16, 1976 DOA: 03/29/2019 PCP: Marva Panda, NP    Brief Narrative:  44 year old with a history of nephrolithiasis and morbid obesity who presented to the [facility], ED with complaints of cough and shortness of breath of 7 days duration.  She was treated for acute bronchitis by her PCP without improvement of her symptoms.  She was found to be Covid + 03/26/2019.  On a follow-up visit to her PCP she was found to be hypoxic and was referred to the ED where she was found to have saturations in the 80s on room air and a fever of 102.  CXR noted bilateral pulmonary infiltrates.  Significant Events: 1/26 Covid test positive at [facility] urgent care 1/29 admit via [facility] ED - transferred to [facility]  COVID-19 specific Treatment: Decadron 1/29 > Remdesivir 1/29 >  Antimicrobials:  None  Subjective: Presently requiring 3 L nasal cannula to maintain saturations in the mid 90s.  States that she feels better overall and is somewhat less short of breath.  Denies chest pain.  Does report ongoing watery diarrhea.  Assessment & Plan:  COVID Pneumonia -acute hypoxic respiratory failure Continue remdesivir and Decadron -wean oxygen as able -with improving respiratory status do not feel that other treatment options are indicated at present  Recent Labs  Lab 03/29/19 1240  DDIMER 0.58*  FERRITIN 599*  CRP 13.9*  ALT 48*  PROCALCITON <0.10    COVID Gastroenteritis Supplement electrolytes - prn imodium   Hypokalemia Likely due to poor oral intake and increased GI losses via diarrhea -supplement -check magnesium  Morbid obesity - Body mass index is 45.5 kg/m.   DVT prophylaxis: lovenox  Code Status: FULL CODE Family Communication:  Disposition Plan: Medical bed -ambulate -control diarrhea  Consultants:  none  Objective: Blood pressure (!) 146/77, pulse 90, temperature 98.2 F (36.8 C), temperature source Oral, resp. rate (!)  23, height 5' (1.524 m), weight 105.7 kg, SpO2 94 %.  Intake/Output Summary (Last 24 hours) at 03/30/2019 0829 Last data filed at 03/30/2019 0300 Gross per 24 hour  Intake 120 ml  Output --  Net 120 ml   Filed Weights   03/29/19 1238 03/30/19 0300  Weight: 104.3 kg 105.7 kg    Examination: General: No acute respiratory distress Lungs: Fine crackles more prominent in bases with no wheezing Cardiovascular: Regular rate and rhythm without murmur gallop or rub normal S1 and S2 Abdomen: Nontender, nondistended, soft, bowel sounds positive, no rebound, no ascites, no appreciable mass Extremities: No significant cyanosis, clubbing, or edema bilateral lower extremities  CBC: Recent Labs  Lab 03/29/19 1240  WBC 11.9*  NEUTROABS 10.5*  HGB 12.8  HCT 41.0  MCV 92.6  PLT 288   Basic Metabolic Panel: Recent Labs  Lab 03/29/19 1240  NA 138  K 3.4*  CL 105  CO2 22  GLUCOSE 104*  BUN 8  CREATININE 0.64  CALCIUM 9.0   GFR: Estimated Creatinine Clearance: 98.6 mL/min (by C-G formula based on SCr of 0.64 mg/dL).  Liver Function Tests: Recent Labs  Lab 03/29/19 1240  AST 21  ALT 48*  ALKPHOS 69  BILITOT 0.3  PROT 6.7  ALBUMIN 3.1*    HbA1C: Hgb A1c MFr Bld  Date/Time Value Ref Range Status  05/02/2014 03:54 PM 5.7 4.6 - 6.5 % Final    Comment:    Glycemic Control Guidelines for People with Diabetes:Non Diabetic:  <6%Goal of Therapy: <7%Additional Action Suggested:  >8%  CBG: No results for input(s): GLUCAP in the last 168 hours.  Recent Results (from the past 240 hour(s))  SARS CORONAVIRUS 2 (TAT 6-24 HRS) Nasopharyngeal Nasopharyngeal Swab     Status: Abnormal   Collection Time: 03/29/19  5:50 PM   Specimen: Nasopharyngeal Swab  Result Value Ref Range Status   SARS Coronavirus 2 POSITIVE (A) NEGATIVE Final    Comment: RESULT CALLED TO, READ BACK BY AND VERIFIED WITH: S. GRINDSTAFF,RN 0017 03/30/2019 T. TYSOR (NOTE) SARS-CoV-2 target nucleic acids are  DETECTED. The SARS-CoV-2 RNA is generally detectable in upper and lower respiratory specimens during the acute phase of infection. Positive results are indicative of the presence of SARS-CoV-2 RNA. Clinical correlation with patient history and other diagnostic information is  necessary to determine patient infection status. Positive results do not rule out bacterial infection or co-infection with other viruses.  The expected result is Negative. Fact Sheet for Patients: SugarRoll.be Fact Sheet for Healthcare Providers: https://www.woods-mathews.com/ This test is not yet approved or cleared by the [place] FDA and  has been authorized for detection and/or diagnosis of SARS-CoV-2 by FDA under an Emergency Use Authorization (EUA). This EUA will remain  in effect (meaning this test can be used) f or the duration of the COVID-19 declaration under Section 564(b)(1) of the Act, 21 U.S.C. section 360bbb-3(b)(1), unless the authorization is terminated or revoked sooner. Performed at [facility], [place] [address], [place], [place] [address]      Scheduled Meds: . albuterol  2 puff Inhalation Q6H  . vitamin C  500 mg Oral Daily  . dexamethasone  6 mg Oral Q24H  . enoxaparin (LOVENOX) injection  0.5 mg/kg Subcutaneous Q24H  . zinc sulfate  220 mg Oral Daily   Continuous Infusions: . remdesivir 100 mg in NS 100 mL       LOS: 1 day   Cherene Altes, MD [facility] Office  [phone] Pager - Text Page per Amion  If 7PM-7AM, please contact night-coverage per Amion 03/30/2019, 8:29 AM

## 2019-03-31 LAB — CBC WITH DIFFERENTIAL/PLATELET
Abs Immature Granulocytes: 0.03 10*3/uL (ref 0.00–0.07)
Basophils Absolute: 0 10*3/uL (ref 0.0–0.1)
Basophils Relative: 0 %
Eosinophils Absolute: 0 10*3/uL (ref 0.0–0.5)
Eosinophils Relative: 0 %
HCT: 39.1 % (ref 36.0–46.0)
Hemoglobin: 12.4 g/dL (ref 12.0–15.0)
Immature Granulocytes: 1 %
Lymphocytes Relative: 30 %
Lymphs Abs: 1.9 10*3/uL (ref 0.7–4.0)
MCH: 28.8 pg (ref 26.0–34.0)
MCHC: 31.7 g/dL (ref 30.0–36.0)
MCV: 90.9 fL (ref 80.0–100.0)
Monocytes Absolute: 0.4 10*3/uL (ref 0.1–1.0)
Monocytes Relative: 6 %
Neutro Abs: 3.9 10*3/uL (ref 1.7–7.7)
Neutrophils Relative %: 63 %
Platelets: 359 10*3/uL (ref 150–400)
RBC: 4.3 MIL/uL (ref 3.87–5.11)
RDW: 14.1 % (ref 11.5–15.5)
WBC: 6.1 10*3/uL (ref 4.0–10.5)
nRBC: 0 % (ref 0.0–0.2)

## 2019-03-31 LAB — COMPREHENSIVE METABOLIC PANEL
ALT: 35 U/L (ref 0–44)
AST: 16 U/L (ref 15–41)
Albumin: 3 g/dL — ABNORMAL LOW (ref 3.5–5.0)
Alkaline Phosphatase: 62 U/L (ref 38–126)
Anion gap: 10 (ref 5–15)
BUN: 14 mg/dL (ref 6–20)
CO2: 26 mmol/L (ref 22–32)
Calcium: 8.9 mg/dL (ref 8.9–10.3)
Chloride: 103 mmol/L (ref 98–111)
Creatinine, Ser: 0.58 mg/dL (ref 0.44–1.00)
GFR calc Af Amer: >60 mL/min (ref >60)
GFR calc non Af Amer: >60 mL/min (ref >60)
Glucose, Bld: 110 mg/dL — ABNORMAL HIGH (ref 70–99)
Potassium: 3.8 mmol/L (ref 3.5–5.1)
Sodium: 139 mmol/L (ref 135–145)
Total Bilirubin: 0.2 mg/dL — ABNORMAL LOW (ref 0.3–1.2)
Total Protein: 7 g/dL (ref 6.5–8.1)

## 2019-03-31 LAB — MAGNESIUM: Magnesium: 2 mg/dL (ref 1.7–2.4)

## 2019-03-31 LAB — D-DIMER, QUANTITATIVE: D-Dimer, Quant: <0.27 ug/mL-FEU (ref 0.00–0.50)

## 2019-03-31 LAB — FERRITIN: Ferritin: 559 ng/mL — ABNORMAL HIGH (ref 11–307)

## 2019-03-31 LAB — C-REACTIVE PROTEIN: CRP: 7.6 mg/dL — ABNORMAL HIGH (ref <1.0)

## 2019-03-31 MED ORDER — DEXAMETHASONE 6 MG PO TABS
6.0000 mg | ORAL_TABLET | Freq: Every day | ORAL | Status: DC
  Administered 2019-04-01: 6 mg via ORAL
  Filled 2019-03-31: qty 1

## 2019-03-31 NOTE — Progress Notes (Addendum)
Patient A&Ox4, independent, VSS, oxygen 3L [place], SpO2>95%.Oxygen decreased to 2L [place]  No complaints of shortness of breath or pain  Patient up to bathroom independently. Patient steady on her feet. Call light within reach. Patient to call for assistance if feeling short of breath or dizzy to maintain safety. Will continue to monitor.  1352: Patients oxygen weaned to 1L [address], SpO2>95. Patient educated to call if shortness of breath or dizziness occurs. Patient has no complaints at this time.  Washed up and linen changed. Patient wearing her own pajamas.

## 2019-03-31 NOTE — Progress Notes (Signed)
Patient ambulated on room air. SpO2 at 90-91%. No complaints of shortness of breath, chest pain or dizziness. Will continue to monitor.

## 2019-03-31 NOTE — Progress Notes (Signed)
Melody Taylor  ZOX:[id number] DOB: [birth year] DOA: 03/29/2019 PCP: Everardo Beals, NP    Brief Narrative:  44 year old with a history of nephrolithiasis and morbid obesity who presented to the [facility], ED with complaints of cough and shortness of breath of 7 days duration.  She was treated for acute bronchitis by her PCP without improvement of her symptoms.  She was found to be Covid + 03/26/2019.  On a follow-up visit to her PCP she was found to be hypoxic and was referred to the ED where she was found to have saturations in the 80s on room air and a fever of 102.  CXR noted bilateral pulmonary infiltrates.  Significant Events: 1/26 Covid test positive at [facility] urgent care 1/29 admit via [facility] - transferred to [facility]  COVID-19 specific Treatment: Decadron 1/29 > Remdesivir 1/29 >  Antimicrobials:  None  Subjective: Remains on 3 L nasal cannula support.  Blood pressure dipping into the upper 90s at times.  Denies any new complaints.  Feels that she is steadily improving.  No significant shortness of breath when sitting still.  Poor appetite at present, but is making herself eat small portions of each meal.  Assessment & Plan:  COVID Pneumonia -acute hypoxic respiratory failure Continue remdesivir and Decadron - wean oxygen as able - with improving respiratory status do not feel that other treatment options are indicated at present -wean oxygen as able -check ambulatory saturations this afternoon and again in the morning  Recent Labs  Lab 03/29/19 1240 03/30/19 0905 03/31/19 0151  DDIMER 0.58* 0.33 <0.27  FERRITIN 599*  --  559*  CRP 13.9* 14.3* 7.6*  ALT 48* 42 35  PROCALCITON <0.10  --   --     COVID Gastroenteritis Supplement electrolytes - prn imodium -improving  Hypokalemia Likely due to poor oral intake and increased GI losses via diarrhea -corrected with supplementation -magnesium is normal  Morbid obesity - Body mass index is 45.5  kg/m.   DVT prophylaxis: lovenox  Code Status: FULL CODE Family Communication:  Disposition Plan: Medical bed -ambulate -control diarrhea  Consultants:  none  Objective: Blood pressure (!) 99/42, pulse 76, temperature 97.9 F (36.6 C), temperature source Oral, resp. rate 18, height 5' (1.524 m), weight 105.7 kg, SpO2 97 %.  Intake/Output Summary (Last 24 hours) at 03/31/2019 0814 Last data filed at 03/31/2019 0450 Gross per 24 hour  Intake 860 ml  Output -  Net 860 ml   Filed Weights   03/29/19 1238 03/30/19 0300  Weight: 104.3 kg 105.7 kg    Examination: General: No acute respiratory distress Lungs: Fine crackles in bases with no wheezing Cardiovascular: RRR without murmur or rub Abdomen: Overweight, soft, bowel sounds positive, no rebound Extremities: No signif edema bilateral lower extremities  CBC: Recent Labs  Lab 03/29/19 1240 03/30/19 0905 03/31/19 0151  WBC 11.9* 8.0 6.1  NEUTROABS 10.5*  --  3.9  HGB 12.8 13.2 12.4  HCT 41.0 42.2 39.1  MCV 92.6 91.5 90.9  PLT 288 336 811   Basic Metabolic Panel: Recent Labs  Lab 03/29/19 1240 03/30/19 0905 03/31/19 0151  NA 138 138 139  K 3.4* 3.5 3.8  CL 105 100 103  CO2 22 23 26   GLUCOSE 104* 134* 110*  BUN 8 13 14   CREATININE 0.64 0.68 0.58  CALCIUM 9.0 8.9 8.9  MG  --   --  2.0   GFR: Estimated Creatinine Clearance: 98.6 mL/min (by C-G formula based on  SCr of 0.58 mg/dL).  Liver Function Tests: Recent Labs  Lab 03/29/19 1240 03/30/19 0905 03/31/19 0151  AST 21 19 16   ALT 48* 42 35  ALKPHOS 69 70 62  BILITOT 0.3 0.6 0.2*  PROT 6.7 7.7 7.0  ALBUMIN 3.1* 3.4* 3.0*    HbA1C: Hgb A1c MFr Bld  Date/Time Value Ref Range Status  05/02/2014 03:54 PM 5.7 4.6 - 6.5 % Final    Comment:    Glycemic Control Guidelines for People with Diabetes:Non Diabetic:  <6%Goal of Therapy: <7%Additional Action Suggested:  >8%     CBG: No results for input(s): GLUCAP in the last 168 hours.  Recent Results  (from the past 240 hour(s))  Blood Culture (routine x 2)     Status: None (Preliminary result)   Collection Time: 03/29/19 12:52 PM   Specimen: BLOOD  Result Value Ref Range Status   Specimen Description BLOOD RIGHT ANTECUBITAL  Final   Special Requests   Final    BOTTLES DRAWN AEROBIC AND ANAEROBIC Blood Culture results may not be optimal due to an inadequate volume of blood received in culture bottles   Culture   Final    NO GROWTH 1 DAY Performed at [facility], [address], [place], [place]    Report Status PENDING  Incomplete  Blood Culture (routine x 2)     Status: None (Preliminary result)   Collection Time: 03/29/19  3:52 PM   Specimen: BLOOD  Result Value Ref Range Status   Specimen Description BLOOD LEFT ANTECUBITAL  Final   Special Requests   Final    BOTTLES DRAWN AEROBIC AND ANAEROBIC Blood Culture adequate volume   Culture   Final    NO GROWTH < 24 HOURS Performed at [facility], [address] [place], [place]    Report Status PENDING  Incomplete  SARS CORONAVIRUS 2 (TAT 6-24 HRS) Nasopharyngeal Nasopharyngeal Swab     Status: Abnormal   Collection Time: 03/29/19  5:50 PM   Specimen: Nasopharyngeal Swab  Result Value Ref Range Status   SARS Coronavirus 2 POSITIVE (A) NEGATIVE Final    Comment: RESULT CALLED TO, READ BACK BY AND VERIFIED WITH: S. GRINDSTAFF,RN 0017 03/30/2019 T. TYSOR (NOTE) SARS-CoV-2 target nucleic acids are DETECTED. The SARS-CoV-2 RNA is generally detectable in upper and lower respiratory specimens during the acute phase of infection. Positive results are indicative of the presence of SARS-CoV-2 RNA. Clinical correlation with patient history and other diagnostic information is  necessary to determine patient infection status. Positive results do not rule out bacterial infection or co-infection with other viruses.  The expected result is Negative. Fact Sheet for Patients:  04/01/2019 Fact Sheet for Healthcare Providers: HairSlick.no This test is not yet approved or cleared by the [employer]ierodir[employer]ir.[employer] FDA and  has been authorized for detection and/or diagnosis of SARS-CoV-2 by FDA under an Emergency Use Authorization (EUA). This EUA will remain  in effect (meaning this test can be used) f or the duration of the COVID-19 declaration under Section 564(b)(1) of the Act, 21 U.S.C. section 360bbb-3(b)(1), unless the authorization is terminated or revoked sooner. Performed at [facility], [address] [place], [place]      Scheduled Meds: . vitamin C  500 mg Oral Daily  . dexamethasone (DECADRON) injection  6 mg Intravenous Q24H  . enoxaparin (LOVENOX) injection  0.5 mg/kg Subcutaneous Q24H  . zinc sulfate  220 mg Oral Daily   Continuous Infusions: .  remdesivir 100 mg in NS 100 mL Stopped (03/30/19 0924)     LOS: 2 days   Lonia Blood, MD [facility] Office  [phone] Pager - Text Page per Amion  If 7PM-7AM, please contact night-coverage per Amion 03/31/2019, 8:14 AM

## 2019-03-31 NOTE — Progress Notes (Signed)
   03/31/19 1133  Family/Significant Other Communication  Family/Significant Other Update Updated (husband Bo)  patient updated husband Bo on phone while RN present in room to answer questions.

## 2019-03-31 NOTE — Evaluation (Signed)
Physical Therapy Evaluation Patient Details Name: Melody Taylor MRN: [id number] DOB: December 25, 1975 Today's Date: 03/31/2019   History of Present Illness  44 year old female with Covid and PNA on admission; PMH  Obesity, nephrolitiasis.  Clinical Impression  Very pleasant and cooperative, receptive to PT. Lives with spouse in 2 story home, but stays on first floor. Was totally independent in all ADLs and works full time from home. No AD for ambulation, and no use of oxygen. Spouse has COVID currently, and 48 year old daughter is just now recovering from [place]. She is currently on 2 lpm, [place]- O2. Should benefit from PT to further address endurance, strength and ambulation- with weaning from O2 as possible. She was reviewed in IS and Flutter valve and did good return demo with both. Was instructed in UE/LE ex format, and printed sheets were issued- advised to perform 2+ times daily, 10 reps each.     Follow Up Recommendations No PT follow up    Equipment Recommendations       Recommendations for Other Services       Precautions / Restrictions Precautions Precautions: None Restrictions Weight Bearing Restrictions: No      Mobility  Bed Mobility Overal bed mobility: Independent                Transfers Overall transfer level: Independent Equipment used: None                Ambulation/Gait Ambulation/Gait assistance: Independent Gait Distance (Feet): 12 Feet Assistive device: None Gait Pattern/deviations: WFL(Within Functional Limits)   Gait velocity interpretation: 1.31 - 2.62 ft/sec, indicative of limited community Conservation officer, historic buildings [place] (Stroke Patients Only)       Balance Overall balance assessment: Mild deficits observed, not formally tested                                           Pertinent Vitals/Pain Pain Assessment: No/denies pain    Home Living Family/patient expects to  be discharged to:: Private residence   Available Help at Discharge: Family;Available 24 hours/day Type of Home: House Home Access: Stairs to enter Entrance Stairs-Rails: Can reach both Entrance Stairs-Number of Steps: 2 Home Layout: Two level Home Equipment: None Additional Comments: She works full time from home    Prior Function Level of Independence: Independent               Hand Dominance   Dominant Hand: Right    Extremity/Trunk Assessment        Lower Extremity Assessment Lower Extremity Assessment: Generalized weakness    Cervical / Trunk Assessment Cervical / Trunk Assessment: Normal  Communication   Communication: No difficulties  Cognition Arousal/Alertness: Awake/alert Behavior During Therapy: WFL for tasks assessed/performed Overall Cognitive Status: Within Functional Limits for tasks assessed                                        General Comments General comments (skin integrity, edema, etc.): Mild edema in LE, but she is obese too. Does not use Oxygen at home. Spouse is recovering from [place]- and 12 year old daughter is just now recovering at home    Exercises General Exercises -  Lower Extremity Ankle Circles/Pumps: AROM;Seated Quad Sets: AROM;Seated Short Arc Quad: AROM;Seated Hip ABduction/ADduction: AROM;Seated Straight Leg Raises: AROM;Seated Hip Flexion/Marching: AROM;Seated Other Exercises Other Exercises: Practiced and reviewed proper use of IS and able to achieve 1000 for 8 of 10 reps. Good return demo with FLUTTER VALVE. reminded to use hourly, 8-10 breaths Other Exercises: Did review shoulder flexion, abduction, and elbow extension- was given printed sheets for HEP.Marland Kitchen   Assessment/Plan    PT Assessment Patient needs continued PT services  PT Problem List Decreased strength;Decreased activity tolerance;Cardiopulmonary status limiting activity       PT Treatment Interventions      PT Goals (Current goals can be  found in the Care Plan section)  Acute Rehab PT Goals Patient Stated Goal: Just to continue to feel better and get home with my family PT Goal Formulation: With patient Time For Goal Achievement: 04/14/19 Potential to Achieve Goals: Good    Frequency Min 3X/week   Barriers to discharge        Co-evaluation               AM-PAC PT "6 Clicks" Mobility  Outcome Measure Help needed turning from your back to your side while in a flat bed without using bedrails?: None Help needed moving from lying on your back to sitting on the side of a flat bed without using bedrails?: None Help needed moving to and from a bed to a chair (including a wheelchair)?: A Little(close supervision, primarily for lines) Help needed standing up from a chair using your arms (e.g., wheelchair or bedside chair)?: A Little(supervision to monitor lines best for safety) Help needed to walk in hospital room?: A Little Help needed climbing 3-5 steps with a railing? : A Little 6 Click Score: 20    End of Session   Activity Tolerance: Patient tolerated treatment well Patient left: in chair;with call bell/phone within reach   PT Visit Diagnosis: Muscle weakness (generalized) (M62.81)    Time: 0174-9449 PT Time Calculation (min) (ACUTE ONLY): 31 min   Charges:   PT Evaluation $PT Eval Moderate Complexity: 1 Mod PT Treatments $Therapeutic Exercise: 8-22 mins       Harriett Rush, PT # [phone] CGV cell   Ephriam Jenkins 03/31/2019, 12:00 PM

## 2019-03-31 NOTE — Plan of Care (Signed)
  Problem: Coping: Goal: Psychosocial and spiritual needs will be supported Outcome: Progressing   Problem: Respiratory: Goal: Will maintain a patent airway Outcome: Progressing   Problem: Education: Goal: Knowledge of risk factors and measures for prevention of condition will improve Outcome: Progressing   

## 2019-04-01 DIAGNOSIS — A0839 Other viral enteritis: Secondary | ICD-10-CM

## 2019-04-01 DIAGNOSIS — J9600 Acute respiratory failure, unspecified whether with hypoxia or hypercapnia: Secondary | ICD-10-CM

## 2019-04-01 DIAGNOSIS — U071 COVID-19: Principal | ICD-10-CM

## 2019-04-01 LAB — COMPREHENSIVE METABOLIC PANEL
ALT: 54 U/L — ABNORMAL HIGH (ref 0–44)
AST: 19 U/L (ref 15–41)
Albumin: 3.1 g/dL — ABNORMAL LOW (ref 3.5–5.0)
Alkaline Phosphatase: 64 U/L (ref 38–126)
Anion gap: 10 (ref 5–15)
BUN: 19 mg/dL (ref 6–20)
CO2: 27 mmol/L (ref 22–32)
Calcium: 8.9 mg/dL (ref 8.9–10.3)
Chloride: 102 mmol/L (ref 98–111)
Creatinine, Ser: 0.73 mg/dL (ref 0.44–1.00)
GFR calc Af Amer: >60 mL/min (ref >60)
GFR calc non Af Amer: >60 mL/min (ref >60)
Glucose, Bld: 90 mg/dL (ref 70–99)
Potassium: 4.5 mmol/L (ref 3.5–5.1)
Sodium: 139 mmol/L (ref 135–145)
Total Bilirubin: 0.5 mg/dL (ref 0.3–1.2)
Total Protein: 7.1 g/dL (ref 6.5–8.1)

## 2019-04-01 LAB — MAGNESIUM: Magnesium: 2.2 mg/dL (ref 1.7–2.4)

## 2019-04-01 LAB — CBC WITH DIFFERENTIAL/PLATELET
Abs Immature Granulocytes: 0.03 10*3/uL (ref 0.00–0.07)
Basophils Absolute: 0 10*3/uL (ref 0.0–0.1)
Basophils Relative: 0 %
Eosinophils Absolute: 0 10*3/uL (ref 0.0–0.5)
Eosinophils Relative: 0 %
HCT: 41.6 % (ref 36.0–46.0)
Hemoglobin: 13.2 g/dL (ref 12.0–15.0)
Immature Granulocytes: 0 %
Lymphocytes Relative: 30 %
Lymphs Abs: 2.3 10*3/uL (ref 0.7–4.0)
MCH: 29.3 pg (ref 26.0–34.0)
MCHC: 31.7 g/dL (ref 30.0–36.0)
MCV: 92.2 fL (ref 80.0–100.0)
Monocytes Absolute: 0.4 10*3/uL (ref 0.1–1.0)
Monocytes Relative: 5 %
Neutro Abs: 4.9 10*3/uL (ref 1.7–7.7)
Neutrophils Relative %: 65 %
Platelets: 415 10*3/uL — ABNORMAL HIGH (ref 150–400)
RBC: 4.51 MIL/uL (ref 3.87–5.11)
RDW: 13.8 % (ref 11.5–15.5)
WBC: 7.6 10*3/uL (ref 4.0–10.5)
nRBC: 0 % (ref 0.0–0.2)

## 2019-04-01 LAB — C-REACTIVE PROTEIN: CRP: 3.1 mg/dL — ABNORMAL HIGH (ref <1.0)

## 2019-04-01 LAB — FERRITIN: Ferritin: 559 ng/mL — ABNORMAL HIGH (ref 11–307)

## 2019-04-01 LAB — D-DIMER, QUANTITATIVE: D-Dimer, Quant: 0.34 ug/mL-FEU (ref 0.00–0.50)

## 2019-04-01 MED ORDER — LOPERAMIDE HCL 2 MG PO CAPS: 2.0000 mg | ORAL_CAPSULE | ORAL | 0 refills | Status: DC | PRN

## 2019-04-01 MED ORDER — DEXAMETHASONE 6 MG PO TABS: 6.0000 mg | ORAL_TABLET | Freq: Every day | ORAL | 0 refills | Status: AC

## 2019-04-01 NOTE — Plan of Care (Signed)
  Problem: Education: Goal: Knowledge of risk factors and measures for prevention of condition will improve 04/01/2019 1015 by Garner Nash, RN Outcome: Adequate for Discharge 04/01/2019 0848 by Garner Nash, RN Outcome: Progressing   Problem: Coping: Goal: Psychosocial and spiritual needs will be supported 04/01/2019 1015 by Garner Nash, RN Outcome: Adequate for Discharge 04/01/2019 0848 by Garner Nash, RN Outcome: Progressing   Problem: Respiratory: Goal: Will maintain a patent airway 04/01/2019 1015 by Garner Nash, RN Outcome: Adequate for Discharge 04/01/2019 0848 by Garner Nash, RN Outcome: Progressing Goal: Complications related to the disease process, condition or treatment will be avoided or minimized 04/01/2019 1015 by Garner Nash, RN Outcome: Adequate for Discharge 04/01/2019 0848 by Garner Nash, RN Outcome: Progressing

## 2019-04-01 NOTE — Discharge Instructions (Signed)
Date of Positive COVID Test: 03/26/19  Date Quarantine Ends: 04/16/19    COVID-19 COVID-19 is a respiratory infection that is caused by a virus called severe acute respiratory syndrome coronavirus 2 (SARS-CoV-2). The disease is also known as coronavirus disease or novel coronavirus. In some people, the virus may not cause any symptoms. In others, it may cause a serious infection. The infection can get worse quickly and can lead to complications, such as:  Pneumonia, or infection of the lungs.  Acute respiratory distress syndrome or ARDS. This is a condition in which fluid build-up in the lungs prevents the lungs from filling with air and passing oxygen into the blood.  Acute respiratory failure. This is a condition in which there is not enough oxygen passing from the lungs to the body or when carbon dioxide is not passing from the lungs out of the body.  Sepsis or septic shock. This is a serious bodily reaction to an infection.  Blood clotting problems.  Secondary infections due to bacteria or fungus.  Organ failure. This is when your body's organs stop working. The virus that causes COVID-19 is contagious. This means that it can spread from person to person through droplets from coughs and sneezes (respiratory secretions). What are the causes? This illness is caused by a virus. You may catch the virus by:  Breathing in droplets from an infected person. Droplets can be spread by a person breathing, speaking, singing, coughing, or sneezing.  Touching something, like a table or a doorknob, that was exposed to the virus (contaminated) and then touching your mouth, nose, or eyes. What increases the risk? Risk for infection You are more likely to be infected with this virus if you:  Are within 6 feet (2 meters) of a person with COVID-19.  Provide care for or live with a person who is infected with COVID-19.  Spend time in crowded indoor spaces or live in shared housing. Risk for  serious illness You are more likely to become seriously ill from the virus if you:  Are 45 years of age or older. The higher your age, the more you are at risk for serious illness.  Live in a nursing home or long-term care facility.  Have cancer.  Have a long-term (chronic) disease such as: ? Chronic lung disease, including chronic obstructive pulmonary disease or asthma. ? A long-term disease that lowers your body's ability to fight infection (immunocompromised). ? Heart disease, including heart failure, a condition in which the arteries that lead to the heart become narrow or blocked (coronary artery disease), a disease which makes the heart muscle thick, weak, or stiff (cardiomyopathy). ? Diabetes. ? Chronic kidney disease. ? Sickle cell disease, a condition in which red blood cells have an abnormal "sickle" shape. ? Liver disease.  Are obese. What are the signs or symptoms? Symptoms of this condition can range from mild to severe. Symptoms may appear any time from 2 to 14 days after being exposed to the virus. They include:  A fever or chills.  A cough.  Difficulty breathing.  Headaches, body aches, or muscle aches.  Runny or stuffy (congested) nose.  A sore throat.  New loss of taste or smell. Some people may also have stomach problems, such as nausea, vomiting, or diarrhea. Other people may not have any symptoms of COVID-19. How is this diagnosed? This condition may be diagnosed based on:  Your signs and symptoms, especially if: ? You live in an area with a COVID-19 outbreak. ? You  recently traveled to or from an area where the virus is common. ? You provide care for or live with a person who was diagnosed with COVID-19. ? You were exposed to a person who was diagnosed with COVID-19.  A physical exam.  Lab tests, which may include: ? Taking a sample of fluid from the back of your nose and throat (nasopharyngeal fluid), your nose, or your throat using a  swab. ? A sample of mucus from your lungs (sputum). ? Blood tests.  Imaging tests, which may include, X-rays, CT scan, or ultrasound. How is this treated? At present, there is no medicine to treat COVID-19. Medicines that treat other diseases are being used on a trial basis to see if they are effective against COVID-19. Your health care provider will talk with you about ways to treat your symptoms. For most people, the infection is mild and can be managed at home with rest, fluids, and over-the-counter medicines. Treatment for a serious infection usually takes places in a hospital intensive care unit (ICU). It may include one or more of the following treatments. These treatments are given until your symptoms improve.  Receiving fluids and medicines through an IV.  Supplemental oxygen. Extra oxygen is given through a tube in the nose, a face mask, or a hood.  Positioning you to lie on your stomach (prone position). This makes it easier for oxygen to get into the lungs.  Continuous positive airway pressure (CPAP) or bi-level positive airway pressure (BPAP) machine. This treatment uses mild air pressure to keep the airways open. A tube that is connected to a motor delivers oxygen to the body.  Ventilator. This treatment moves air into and out of the lungs by using a tube that is placed in your windpipe.  Tracheostomy. This is a procedure to create a hole in the neck so that a breathing tube can be inserted.  Extracorporeal membrane oxygenation (ECMO). This procedure gives the lungs a chance to recover by taking over the functions of the heart and lungs. It supplies oxygen to the body and removes carbon dioxide. Follow these instructions at home: Lifestyle  If you are sick, stay home except to get medical care. Your health care provider will tell you how long to stay home. Call your health care provider before you go for medical care.  Rest at home as told by your health care provider.  Do  not use any products that contain nicotine or tobacco, such as cigarettes, e-cigarettes, and chewing tobacco. If you need help quitting, ask your health care provider.  Return to your normal activities as told by your health care provider. Ask your health care provider what activities are safe for you. General instructions  Take over-the-counter and prescription medicines only as told by your health care provider.  Drink enough fluid to keep your urine pale yellow.  Keep all follow-up visits as told by your health care provider. This is important. How is this prevented?  There is no vaccine to help prevent COVID-19 infection. However, there are steps you can take to protect yourself and others from this virus. To protect yourself:   Do not travel to areas where COVID-19 is a risk. The areas where COVID-19 is reported change often. To identify high-risk areas and travel restrictions, check the CDC travel website: FatFares.com.br  If you live in, or must travel to, an area where COVID-19 is a risk, take precautions to avoid infection. ? Stay away from people who are sick. ? Wash  your hands often with soap and water for 20 seconds. If soap and water are not available, use an alcohol-based hand sanitizer. ? Avoid touching your mouth, face, eyes, or nose. ? Avoid going out in public, follow guidance from your state and local health authorities. ? If you must go out in public, wear a cloth face covering or face mask. Make sure your mask covers your nose and mouth. ? Avoid crowded indoor spaces. Stay at least 6 feet (2 meters) away from others. ? Disinfect objects and surfaces that are frequently touched every day. This may include:  Counters and tables.  Doorknobs and light switches.  Sinks and faucets.  Electronics, such as phones, remote controls, keyboards, computers, and tablets. To protect others: If you have symptoms of COVID-19, take steps to prevent the virus from  spreading to others.  If you think you have a COVID-19 infection, contact your health care provider right away. Tell your health care team that you think you may have a COVID-19 infection.  Stay home. Leave your house only to seek medical care. Do not use public transport.  Do not travel while you are sick.  Wash your hands often with soap and water for 20 seconds. If soap and water are not available, use alcohol-based hand sanitizer.  Stay away from other members of your household. Let healthy household members care for children and pets, if possible. If you have to care for children or pets, wash your hands often and wear a mask. If possible, stay in your own room, separate from others. Use a different bathroom.  Make sure that all people in your household wash their hands well and often.  Cough or sneeze into a tissue or your sleeve or elbow. Do not cough or sneeze into your hand or into the air.  Wear a cloth face covering or face mask. Make sure your mask covers your nose and mouth. Where to find more information  Centers for Disease Control and Prevention: PurpleGadgets.be  World Health Organization: https://www.castaneda.info/ Contact a health care provider if:  You live in or have traveled to an area where COVID-19 is a risk and you have symptoms of the infection.  You have had contact with someone who has COVID-19 and you have symptoms of the infection. Get help right away if:  You have trouble breathing.  You have pain or pressure in your chest.  You have confusion.  You have bluish lips and fingernails.  You have difficulty waking from sleep.  You have symptoms that get worse. These symptoms may represent a serious problem that is an emergency. Do not wait to see if the symptoms will go away. Get medical help right away. Call your local emergency services (911 in the [place] Do not drive yourself to the hospital. Let the  emergency medical personnel know if you think you have COVID-19. Summary  COVID-19 is a respiratory infection that is caused by a virus. It is also known as coronavirus disease or novel coronavirus. It can cause serious infections, such as pneumonia, acute respiratory distress syndrome, acute respiratory failure, or sepsis.  The virus that causes COVID-19 is contagious. This means that it can spread from person to person through droplets from breathing, speaking, singing, coughing, or sneezing.  You are more likely to develop a serious illness if you are 25 years of age or older, have a weak immune system, live in a nursing home, or have chronic disease.  There is no medicine to treat COVID-19.  Your health care provider will talk with you about ways to treat your symptoms.  Take steps to protect yourself and others from infection. Wash your hands often and disinfect objects and surfaces that are frequently touched every day. Stay away from people who are sick and wear a mask if you are sick. This information is not intended to replace advice given to you by your health care provider. Make sure you discuss any questions you have with your health care provider. Document Revised: 12/14/2018 Document Reviewed: 03/22/2018 Elsevier Patient Education  2020 [place].  COVID-19: How to Protect Yourself and Others Know how it spreads  There is currently no vaccine to prevent coronavirus disease 2019 (COVID-19).  The best way to prevent illness is to avoid being exposed to this virus.  The virus is thought to spread mainly from person-to-person. ? Between people who are in close contact with one another (within about 6 feet). ? Through respiratory droplets produced when an infected person coughs, sneezes or talks. ? These droplets can land in the mouths or noses of people who are nearby or possibly be inhaled into the lungs. ? COVID-19 may be spread by people who are not showing  symptoms. Everyone should Clean your hands often  Wash your hands often with soap and water for at least 20 seconds especially after you have been in a public place, or after blowing your nose, coughing, or sneezing.  If soap and water are not readily available, use a hand sanitizer that contains at least 60% alcohol. Cover all surfaces of your hands and rub them together until they feel dry.  Avoid touching your eyes, nose, and mouth with unwashed hands. Avoid close contact  Limit contact with others as much as possible.  Avoid close contact with people who are sick.  Put distance between yourself and other people. ? Remember that some people without symptoms may be able to spread virus. ? This is especially important for people who are at higher risk of getting very GainPain.com.cy Cover your mouth and nose with a mask when around others  You could spread COVID-19 to others even if you do not feel sick.  Everyone should wear a mask in public settings and when around people not living in their household, especially when social distancing is difficult to maintain. ? Masks should not be placed on young children under age 6, anyone who has trouble breathing, or is unconscious, incapacitated or otherwise unable to remove the mask without assistance.  The mask is meant to protect other people in case you are infected.  Do NOT use a facemask meant for a Dietitian.  Continue to keep about 6 feet between yourself and others. The mask is not a substitute for social distancing. Cover coughs and sneezes  Always cover your mouth and nose with a tissue when you cough or sneeze or use the inside of your elbow.  Throw used tissues in the trash.  Immediately wash your hands with soap and water for at least 20 seconds. If soap and water are not readily available, clean your hands with a hand sanitizer that contains  at least 60% alcohol. Clean and disinfect  Clean AND disinfect frequently touched surfaces daily. This includes tables, doorknobs, light switches, countertops, handles, desks, phones, keyboards, toilets, faucets, and sinks. RackRewards.fr  If surfaces are dirty, clean them: Use detergent or soap and water prior to disinfection.  Then, use a household disinfectant. You can see a list of EPA-registered household disinfectants  here. michellinders.com 10/31/2018 This information is not intended to replace advice given to you by your health care provider. Make sure you discuss any questions you have with your health care provider. Document Revised: 11/08/2018 Document Reviewed: 09/06/2018 Elsevier Patient Education  [place].   COVID-19 Frequently Asked Questions COVID-19 (coronavirus disease) is an infection that is caused by a large family of viruses. Some viruses cause illness in people and others cause illness in animals like camels, cats, and bats. In some cases, the viruses that cause illness in animals can spread to humans. Where did the coronavirus come from? In December 2019, [place] told the [facility]) of several cases of lung disease (human respiratory illness). These cases were linked to an open seafood and livestock market in the city of [place]. The link to the seafood and livestock market suggests that the virus may have spread from animals to humans. However, since that first outbreak in December, the virus has also been shown to spread from person to person. What is the name of the disease and the virus? Disease name Early on, this disease was called novel coronavirus. This is because scientists determined that the disease was caused by a new (novel) respiratory virus. The World Health Organization [facility]) has now named the disease COVID-19, or coronavirus disease. Virus name The virus  that causes the disease is called severe acute respiratory syndrome coronavirus 2 (SARS-CoV-2). More information on disease and virus naming World Health Organization [facility]): www.who.int/emergencies/diseases/novel-coronavirus-2019/technical-guidance/naming-the-coronavirus-disease-(covid-2019)-and-the-virus-that-causes-it Who is at risk for complications from coronavirus disease? Some people may be at higher risk for complications from coronavirus disease. This includes older adults and people who have chronic diseases, such as heart disease, diabetes, and lung disease. If you are at higher risk for complications, take these extra precautions:  Stay home as much as possible.  Avoid social gatherings and travel.  Avoid close contact with others. Stay at least 6 ft (2 m) away from others, if possible.  Wash your hands often with soap and water for at least 20 seconds.  Avoid touching your face, mouth, nose, or eyes.  Keep supplies on hand at home, such as food, medicine, and cleaning supplies.  If you must go out in public, wear a cloth face covering or face mask. Make sure your mask covers your nose and mouth. How does coronavirus disease spread? The virus that causes coronavirus disease spreads easily from person to person (is contagious). You may catch the virus by:  Breathing in droplets from an infected person. Droplets can be spread by a person breathing, speaking, singing, coughing, or sneezing.  Touching something, like a table or a doorknob, that was exposed to the virus (contaminated) and then touching your mouth, nose, or eyes. Can I get the virus from touching surfaces or objects? There is still a lot that we do not know about the virus that causes coronavirus disease. Scientists are basing a lot of information on what they know about similar viruses, such as:  Viruses cannot generally survive on surfaces for long. They need a human body (host) to survive.  It is more likely  that the virus is spread by close contact with people who are sick (direct contact), such as through: ? Shaking hands or hugging. ? Breathing in respiratory droplets that travel through the air. Droplets can be spread by a person breathing, speaking, singing, coughing, or sneezing.  It is less likely that the virus is spread when a person touches a surface or object  that has the virus on it (indirect contact). The virus may be able to enter the body if the person touches a surface or object and then touches his or her face, eyes, nose, or mouth. Can a person spread the virus without having symptoms of the disease? It may be possible for the virus to spread before a person has symptoms of the disease, but this is most likely not the main way the virus is spreading. It is more likely for the virus to spread by being in close contact with people who are sick and breathing in the respiratory droplets spread by a person breathing, speaking, singing, coughing, or sneezing. What are the symptoms of coronavirus disease? Symptoms vary from person to person and can range from mild to severe. Symptoms may include:  Fever or chills.  Cough.  Difficulty breathing or feeling short of breath.  Headaches, body aches, or muscle aches.  Runny or stuffy (congested) nose.  Sore throat.  New loss of taste or smell.  Nausea, vomiting, or diarrhea. These symptoms can appear anywhere from 2 to 14 days after you have been exposed to the virus. Some people may not have any symptoms. If you develop symptoms, call your health care provider. People with severe symptoms may need hospital care. Should I be tested for this virus? Your health care provider will decide whether to test you based on your symptoms, history of exposure, and your risk factors. How does a health care provider test for this virus? Health care providers will collect samples to send for testing. Samples may include:  Taking a swab of fluid from  the back of your nose and throat, your nose, or your throat.  Taking fluid from the lungs by having you cough up mucus (sputum) into a sterile cup.  Taking a blood sample. Is there a treatment or vaccine for this virus? Currently, there is no vaccine to prevent coronavirus disease. Also, there are no medicines like antibiotics or antivirals to treat the virus. A person who becomes sick is given supportive care, which means rest and fluids. A person may also relieve his or her symptoms by using over-the-counter medicines that treat sneezing, coughing, and runny nose. These are the same medicines that a person takes for the common cold. If you develop symptoms, call your health care provider. People with severe symptoms may need hospital care. What can I do to protect myself and my family from this virus?     You can protect yourself and your family by taking the same actions that you would take to prevent the spread of other viruses. Take the following actions:  Wash your hands often with soap and water for at least 20 seconds. If soap and water are not available, use alcohol-based hand sanitizer.  Avoid touching your face, mouth, nose, or eyes.  Cough or sneeze into a tissue, sleeve, or elbow. Do not cough or sneeze into your hand or the air. ? If you cough or sneeze into a tissue, throw it away immediately and wash your hands.  Disinfect objects and surfaces that you frequently touch every day.  Stay away from people who are sick.  Avoid going out in public, follow guidance from your state and local health authorities.  Avoid crowded indoor spaces. Stay at least 6 ft (2 m) away from others.  If you must go out in public, wear a cloth face covering or face mask. Make sure your mask covers your nose and mouth.  Stay  home if you are sick, except to get medical care. Call your health care provider before you get medical care. Your health care provider will tell you how long to stay  home.  Make sure your vaccines are up to date. Ask your health care provider what vaccines you need. What should I do if I need to travel? Follow travel recommendations from your local health authority, the CDC, and WHO. Travel information and advice  Centers for Disease Control and Prevention (CDC): BodyEditor.hu  World Health Organization [facility]): ThirdIncome.ca Know the risks and take action to protect your health  You are at higher risk of getting coronavirus disease if you are traveling to areas with an outbreak or if you are exposed to travelers from areas with an outbreak.  Wash your hands often and practice good hygiene to lower the risk of catching or spreading the virus. What should I do if I am sick? General instructions to stop the spread of infection  Wash your hands often with soap and water for at least 20 seconds. If soap and water are not available, use alcohol-based hand sanitizer.  Cough or sneeze into a tissue, sleeve, or elbow. Do not cough or sneeze into your hand or the air.  If you cough or sneeze into a tissue, throw it away immediately and wash your hands.  Stay home unless you must get medical care. Call your health care provider or local health authority before you get medical care.  Avoid public areas. Do not take public transportation, if possible.  If you can, wear a mask if you must go out of the house or if you are in close contact with someone who is not sick. Make sure your mask covers your nose and mouth. Keep your home clean  Disinfect objects and surfaces that are frequently touched every day. This may include: ? Counters and tables. ? Doorknobs and light switches. ? Sinks and faucets. ? Electronics such as phones, remote controls, keyboards, computers, and tablets.  Wash dishes in hot, soapy water or use a dishwasher. Air-dry your dishes.  Wash  laundry in hot water. Prevent infecting other household members  Let healthy household members care for children and pets, if possible. If you have to care for children or pets, wash your hands often and wear a mask.  Sleep in a different bedroom or bed, if possible.  Do not share personal items, such as razors, toothbrushes, deodorant, combs, brushes, towels, and washcloths. Where to find more information Centers for Disease Control and Prevention (CDC)  Information and news updates: https://www.butler-gonzalez.com/ World Health Organization [facility])  Information and news updates: MissExecutive.com.ee  Coronavirus health topic: https://www.castaneda.info/  Questions and answers on COVID-19: OpportunityDebt.at  Global tracker: who.sprinklr.com American Academy of Pediatrics (AAP)  Information for families: www.healthychildren.org/English/health-issues/conditions/chest-lungs/Pages/2019-Novel-Coronavirus.aspx The coronavirus situation is changing rapidly. Check your local health authority website or the CDC and [facility] websites for updates and news. When should I contact a health care provider?  Contact your health care provider if you have symptoms of an infection, such as fever or cough, and you: ? Have been near anyone who is known to have coronavirus disease. ? Have come into contact with a person who is suspected to have coronavirus disease. ? Have traveled to an area where there is an outbreak of COVID-19. When should I get emergency medical care?  Get help right away by calling your local emergency services (911 in the [place] if you have: ? Trouble breathing. ? Pain or pressure  in your chest. ? Confusion. ? Blue-tinged lips and fingernails. ? Difficulty waking from sleep. ? Symptoms that get worse. Let the emergency medical personnel know if you think you have coronavirus disease. Summary  A new  respiratory virus is spreading from person to person and causing COVID-19 (coronavirus disease).  The virus that causes COVID-19 appears to spread easily. It spreads from one person to another through droplets from breathing, speaking, singing, coughing, or sneezing.  Older adults and those with chronic diseases are at higher risk of disease. If you are at higher risk for complications, take extra precautions.  There is currently no vaccine to prevent coronavirus disease. There are no medicines, such as antibiotics or antivirals, to treat the virus.  You can protect yourself and your family by washing your hands often, avoiding touching your face, and covering your coughs and sneezes. This information is not intended to replace advice given to you by your health care provider. Make sure you discuss any questions you have with your health care provider. Document Revised: 12/14/2018 Document Reviewed: 06/12/2018 Elsevier Patient Education  [place].

## 2019-04-01 NOTE — Evaluation (Signed)
Occupational Therapy Evaluation Patient Details Name: Melody Taylor MRN: [id number] DOB: [birth year] Today's Date: 04/01/2019    History of Present Illness 44 year old female with Covid and PNA on admission; PMH  Obesity, nephrolitiasis.   Clinical Impression   PTA, pt was living with her husband and daughter and was independent and working from home. Pt performing ADLs and functional mobility at Mod I - I level with increased time and rest breaks as needed. Provided pt with education and handout on [facility] for ADLs and IADLs; pt verbalized understanding. SpO2 90s on RA throughout. Pt performing 10 reps of flutter valve and 5 reps of IS. Answered all pt questions. Recommend dc home once medically stable per physician. All acute OT needs met and will sign off. Thank you.     Follow Up Recommendations  No OT follow up    Equipment Recommendations  None recommended by OT    Recommendations for Other Services       Precautions / Restrictions Precautions Precautions: None Restrictions Weight Bearing Restrictions: No      Mobility Bed Mobility Overal bed mobility: Independent                Transfers Overall transfer level: Independent Equipment used: None                  Balance Overall balance assessment: No apparent balance deficits (not formally assessed)                                         ADL either performed or assessed with clinical judgement   ADL Overall ADL's : Modified independent                                       General ADL Comments: Pt performing ADLs at Mod I level with increased time and rest breaks. Providing pt wiht handout and education on [facility] for ADLs and IADLs. Pt verbalized understanding.     Vision         Perception     Praxis      Pertinent Vitals/Pain Pain Assessment: No/denies pain     Hand Dominance Right   Extremity/Trunk Assessment Upper Extremity Assessment Upper Extremity  Assessment: Overall WFL for tasks assessed   Lower Extremity Assessment Lower Extremity Assessment: Defer to PT evaluation   Cervical / Trunk Assessment Cervical / Trunk Assessment: Normal   Communication Communication Communication: No difficulties   Cognition Arousal/Alertness: Awake/alert Behavior During Therapy: WFL for tasks assessed/performed Overall Cognitive Status: Within Functional Limits for tasks assessed                                     General Comments  SpO2 >91% on RA throughout    Exercises Exercises: Other exercises Other Exercises Other Exercises: Flutter valve. 10 reps.  Other Exercises: IS. 5 reps. 100 ml   Shoulder Instructions      Home Living Family/patient expects to be discharged to:: Private residence Living Arrangements: Spouse/significant other;Children(15 yo daughter) Available Help at Discharge: Family;Available 24 hours/day Type of Home: House Home Access: Stairs to enter [employer] of Steps: 2 Entrance Stairs-Rails: Can reach both Home Layout: Two level Alternate Level Stairs-Number of Steps: Stays  downstairs according to the patient. Alternate Level Stairs-Rails: Can reach both Bathroom Shower/Tub: Occupational psychologist: Standard Bathroom Accessibility: Yes   Home Equipment: Civil engineer, contracting - built in   Additional Comments: She works full time from home      Prior Functioning/Environment Level of Independence: Independent                 OT Problem List: Decreased activity tolerance;Decreased knowledge of use of DME or AE;Decreased knowledge of precautions;Cardiopulmonary status limiting activity      OT Treatment/Interventions:      OT Goals(Current goals can be found in the care plan section) Acute Rehab OT Goals Patient Stated Goal: "I really hope to go home today to be with my family." OT Goal Formulation: All assessment and education complete, DC therapy  OT Frequency:      Barriers to D/C:            Co-evaluation              AM-PAC OT "6 Clicks" Daily Activity     Outcome Measure Help from another person eating meals?: None Help from another person taking care of personal grooming?: None Help from another person toileting, which includes using toliet, bedpan, or urinal?: None Help from another person bathing (including washing, rinsing, drying)?: None Help from another person to put on and taking off regular upper body clothing?: None Help from another person to put on and taking off regular lower body clothing?: None 6 Click Score: 24   End of Session Nurse Communication: Mobility status  Activity Tolerance: Patient tolerated treatment well Patient left: in chair;with call bell/phone within reach  OT Visit Diagnosis: Muscle weakness (generalized) (M62.81)                Time: 9980-0123 OT Time Calculation (min): 18 min Charges:  OT General Charges $OT Visit: 1 Visit OT Evaluation $OT Eval Low Complexity: 1 Low  Michoel Kunin MSOT, OTR/L Acute Rehab Pager: [phone] Office: [place] 04/01/2019, 8:25 AM

## 2019-04-01 NOTE — Plan of Care (Signed)
  Problem: Education: Goal: Knowledge of risk factors and measures for prevention of condition will improve Outcome: Progressing   Problem: Coping: Goal: Psychosocial and spiritual needs will be supported Outcome: Progressing   Problem: Respiratory: Goal: Will maintain a patent airway Outcome: Progressing Goal: Complications related to the disease process, condition or treatment will be avoided or minimized Outcome: Progressing   

## 2019-04-01 NOTE — Discharge Summary (Signed)
DISCHARGE SUMMARY  Melody Taylor  MR#: [id number]  DOB:[birth year]  Date of Admission: 03/29/2019 Date of Discharge: 04/01/2019  Attending Physician:Jatin Naumann Hennie Duos, MD  Patient's MHD:[id number], Joelene Millin, NP  Consults: none   Disposition: d/c home   Date of Positive COVID Test: 03/26/19  Date Quarantine Ends: 04/16/19  COVID-19 specific Treatment: Decadron 1/29 > 2/4 Remdesivir 1/29 > 2/1  Follow-up Appts: Follow-up Information    Everardo Beals, NP Follow up in 1 week(s).   Contact information: [address] [place] [address] [phone]           Discharge Diagnoses: COVID Pneumonia Acute hypoxic respiratory failure COVID Gastroenteritis Hypokalemia Morbid obesity - Body mass index is 45.5 kg/m.  Initial presentation: 44 year old with a history of nephrolithiasis and obesity who presented to the [facility] ED with complaints of cough and shortness of breath of 7 days duration.  She was treated for acute bronchitis by her PCP without improvement in her symptoms.  She was found to be Covid + 03/26/2019.  On a follow-up visit to her PCP she was found to be hypoxic and was referred to the ED where she was found to have saturations in the 80s on room air and a fever of 102.  CXR noted bilateral pulmonary infiltrates.  Hospital Course:  COVID Pneumonia -acute hypoxic respiratory failure Completed 4 doses of remdesivir and given rapid signif improvement f/u as outpt for a 5th dose was not felt to be indicated - decadron to continue post-d/c to complete a 7 day course - weaned to RA both at rest and w/ exertion prior to d/c home   Recent Labs  Lab 03/29/19 1240 03/30/19 0905 03/31/19 0151 04/01/19 0343  DDIMER 0.58* 0.33 <0.27 0.34  FERRITIN 599*  --  559* 559*  CRP 13.9* 14.3* 7.6* 3.1*  ALT 48* 42 35 54*  PROCALCITON <0.10  --   --   --     COVID Gastroenteritis Supplemented electrolytes - prn imodium - much improved at time of d/c w/ no  diarrhea for 24hrs   Hypokalemia Likely due to poor oral intake and increased GI losses via diarrhea -corrected with supplementation -magnesium note to be normal  Morbid obesity - Body mass index is 45.5 kg/m.  Allergies as of 04/01/2019   No Known Allergies     Medication List    TAKE these medications   acetaminophen 500 MG tablet Commonly known as: TYLENOL Take 1,000 mg by mouth every 6 (six) hours as needed for mild pain.   dexamethasone 6 MG tablet Commonly known as: DECADRON Take 1 tablet (6 mg total) by mouth daily for 3 days. Start taking on: April 02, 2019   FLUoxetine 20 MG capsule Commonly known as: PROZAC Take 20 mg by mouth as needed (for anxiety).   loperamide 2 MG capsule Commonly known as: IMODIUM Take 1 capsule (2 mg total) by mouth as needed for diarrhea or loose stools.   ondansetron 8 MG disintegrating tablet Commonly known as: ZOFRAN-ODT Take 8 mg by mouth every 8 (eight) hours as needed for nausea or vomiting.   traMADol 50 MG tablet Commonly known as: ULTRAM Take 50 mg by mouth 2 (two) times daily as needed for cramping.       Day of Discharge BP (!) 103/59 (BP Location: Left Arm)   Pulse 73   Temp 97.9 F (36.6 C) (Oral)   Resp 20   Ht 5' (1.524 m)   Wt 105.7 kg   SpO2 97%  BMI 45.50 kg/m   Physical Exam: General: No acute respiratory distress Lungs: Clear to auscultation bilaterally without wheezes or crackles Cardiovascular: Regular rate and rhythm without murmur gallop or rub normal S1 and S2 Abdomen: Nontender, nondistended, soft, bowel sounds positive, no rebound, no ascites, no appreciable mass Extremities: No significant cyanosis, clubbing, or edema bilateral lower extremities  Basic Metabolic Panel: Recent Labs  Lab 03/29/19 1240 03/30/19 0905 03/31/19 0151 04/01/19 0343  NA 138 138 139 139  K 3.4* 3.5 3.8 4.5  CL 105 100 103 102  CO2 22 23 26 27   GLUCOSE 104* 134* 110* 90  BUN 8 13 14 19   CREATININE 0.64  0.68 0.58 0.73  CALCIUM 9.0 8.9 8.9 8.9  MG  --   --  2.0 2.2    Liver Function Tests: Recent Labs  Lab 03/29/19 1240 03/30/19 0905 03/31/19 0151 04/01/19 0343  AST 21 19 16 19   ALT 48* 42 35 54*  ALKPHOS 69 70 62 64  BILITOT 0.3 0.6 0.2* 0.5  PROT 6.7 7.7 7.0 7.1  ALBUMIN 3.1* 3.4* 3.0* 3.1*   CBC: Recent Labs  Lab 03/29/19 1240 03/30/19 0905 03/31/19 0151 04/01/19 0343  WBC 11.9* 8.0 6.1 7.6  NEUTROABS 10.5*  --  3.9 4.9  HGB 12.8 13.2 12.4 13.2  HCT 41.0 42.2 39.1 41.6  MCV 92.6 91.5 90.9 92.2  PLT 288 336 359 415*    BNP (last 3 results) Recent Labs    03/29/19 1330  BNP 40.6    Recent Results (from the past 240 hour(s))  Blood Culture (routine x 2)     Status: None (Preliminary result)   Collection Time: 03/29/19 12:52 PM   Specimen: BLOOD  Result Value Ref Range Status   Specimen Description BLOOD RIGHT ANTECUBITAL  Final   Special Requests   Final    BOTTLES DRAWN AEROBIC AND ANAEROBIC Blood Culture results may not be optimal due to an inadequate volume of blood received in culture bottles   Culture   Final    NO GROWTH 2 DAYS Performed at [facility], [address] [place], [facility] [address]    Report Status PENDING  Incomplete  Blood Culture (routine x 2)     Status: None (Preliminary result)   Collection Time: 03/29/19  3:52 PM   Specimen: BLOOD  Result Value Ref Range Status   Specimen Description BLOOD LEFT ANTECUBITAL  Final   Special Requests   Final    BOTTLES DRAWN AEROBIC AND ANAEROBIC Blood Culture adequate volume   Culture   Final    NO GROWTH 2 DAYS Performed at [facility], [address] [place], [facility] [address]    Report Status PENDING  Incomplete  SARS CORONAVIRUS 2 (TAT 6-24 HRS) Nasopharyngeal Nasopharyngeal Swab     Status: Abnormal   Collection Time: 03/29/19  5:50 PM   Specimen: Nasopharyngeal Swab  Result Value Ref Range Status   SARS Coronavirus 2 POSITIVE (A) NEGATIVE Final    Comment: RESULT  CALLED TO, READ BACK BY AND VERIFIED WITH: S. GRINDSTAFF,RN 0017 03/30/2019 T. TYSOR (NOTE) SARS-CoV-2 target nucleic acids are DETECTED. The SARS-CoV-2 RNA is generally detectable in upper and lower respiratory specimens during the acute phase of infection. Positive results are indicative of the presence of SARS-CoV-2 RNA. Clinical correlation with patient history and other diagnostic information is  necessary to determine patient infection status. Positive results do not rule out bacterial infection or co-infection with other viruses.  The  expected result is Negative. Fact Sheet for Patients: HairSlick.no Fact Sheet for Healthcare Providers: quierodirigir.com This test is not yet approved or cleared by the [place] FDA and  has been authorized for detection and/or diagnosis of SARS-CoV-2 by FDA under an Emergency Use Authorization (EUA). This EUA will remain  in effect (meaning this test can be used) f or the duration of the COVID-19 declaration under Section 564(b)(1) of the Act, 21 U.S.C. section 360bbb-3(b)(1), unless the authorization is terminated or revoked sooner. Performed at [facility], [address] [place], [place] [address]      Time spent in discharge (includes decision making & examination of pt): 35 minutes  04/01/2019, 10:11 AM   Lonia Blood, MD [facility] Office  [phone]

## 2019-04-03 LAB — CULTURE, BLOOD (ROUTINE X 2)
Culture: NO GROWTH
Culture: NO GROWTH
Special Requests: ADEQUATE

## 2019-06-20 NOTE — Progress Notes (Signed)
Virtual Visit via Video Note  I connected with Melody Taylor  on 06/21/19 at 10:00 AM EDT by a video enabled telemedicine application and verified that I am speaking with the correct person using two identifiers.  Location patient: home Location provider:work or home office Persons participating in the virtual visit: patient, provider  I discussed the limitations of evaluation and management by telemedicine and the availability of in person appointments. The patient expressed understanding and agreed to proceed.   Melody Taylor DOB: 1975-03-08 Encounter date: 06/21/2019  This is a 44 y.o. female who presents with Chief Complaint  Patient presents with  . Establish Care    History of present illness: Just wanted to get family doctor. Got COVID end of January and felt she really needed to get regular doc.   Was winded after infection, but feels she is doing better at this point.   Getting vaccine in may and excited about this.   Hasn't had basic bloodwork in over a year.  Seasonal allergies: using claritin, but not feeling like this helped much. Had some nosebleeds after COVID with oxygen in hospital. This is better. More sores in nose. Uses neosporin which helps.    Had GERD for awhile, but with COVID scare she started with healthy eating, exercise. Watching diet and this has helped. Has lost about 11lbs since having COVID.   Every once in awhile gets leg pain, but this has been better with weight loss. Really up and down steps much easier.   Has had kidney stones in past. Last one she passed was last summer. Sees urology ([facility])  No hx of depression. More anxiety. Fluoxetine helps. Doesn't take it every day. Uses more when she feels anxiety come on. Has been using about twice weekly for last month.    No Known Allergies Current Meds  Medication Sig  . FLUoxetine (PROZAC) 20 MG capsule Take 20 mg by mouth as needed (for anxiety).   Marland Kitchen loratadine (CLARITIN) 10 MG tablet  Take 10 mg by mouth daily.  . traMADol (ULTRAM) 50 MG tablet Take 50 mg by mouth 2 (two) times daily as needed for cramping.    Review of Systems  Constitutional: Negative for chills, fatigue and fever.  Respiratory: Negative for cough, chest tightness, shortness of breath and wheezing.   Cardiovascular: Negative for chest pain, palpitations and leg swelling.    Objective:  LMP 06/21/2019 (Exact Date)       BP Readings from Last 3 Encounters:  04/01/19 (!) 121/47  05/02/16 106/77  03/14/16 135/73   Wt Readings from Last 3 Encounters:  03/30/19 233 lb (105.7 kg)  05/02/16 235 lb (106.6 kg)  03/14/16 229 lb 8 oz (104.1 kg)    EXAM:  GENERAL: alert, oriented, appears well and in no acute distress  HEENT: atraumatic, conjunctiva clear, no obvious abnormalities on inspection of external nose and ears  NECK: normal movements of the head and neck  LUNGS: on inspection no signs of respiratory distress, breathing rate appears normal, no obvious gross SOB, gasping or wheezing  CV: no obvious cyanosis  MS: moves all visible extremities without noticeable abnormality  PSYCH/NEURO: pleasant and cooperative, no obvious depression or anxiety, speech and thought processing grossly intact   Assessment/Plan   1. Lipid screening - Lipid panel; Future  2. Screening for diabetes mellitus - Comprehensive metabolic panel; Future  3. Excessive bleeding in premenopausal period Follows with gyn. Will check baseline cbc and tsh. - CBC with Differential/Platelet; Future -  TSH; Future  4. Elevated ferritin On recent hospitalization; historically has had low iron levels. - IBC + Ferritin; Future  5. Seasonal allergies Taking claritin. Advised nasal saline rinses. Can consider flonase, but would use with vasoline/humidifier to help prevent drying/epistaxis.   I discussed the assessment and treatment plan with the patient. The patient was provided an opportunity to ask questions and  all were answered. The patient agreed with the plan and demonstrated an understanding of the instructions.   The patient was advised to call back or seek an in-person evaluation if the symptoms worsen or if the condition fails to improve as anticipated.  I provided 22 minutes of non-face-to-face time during this encounter. Return in about 3 months (around 09/20/2019) for physical exam.   Micheline Rough, MD

## 2019-06-21 ENCOUNTER — Encounter: Payer: Self-pay | Admitting: Family Medicine

## 2019-06-21 ENCOUNTER — Telehealth (INDEPENDENT_AMBULATORY_CARE_PROVIDER_SITE_OTHER): Payer: Self-pay | Admitting: Family Medicine

## 2019-06-21 VITALS — Wt 226.2 lb

## 2019-06-21 DIAGNOSIS — R7989 Other specified abnormal findings of blood chemistry: Secondary | ICD-10-CM

## 2019-06-21 DIAGNOSIS — Z13220 Encounter for screening for lipoid disorders: Secondary | ICD-10-CM

## 2019-06-21 DIAGNOSIS — Z131 Encounter for screening for diabetes mellitus: Secondary | ICD-10-CM

## 2019-06-21 DIAGNOSIS — N924 Excessive bleeding in the premenopausal period: Secondary | ICD-10-CM

## 2019-06-21 DIAGNOSIS — J302 Other seasonal allergic rhinitis: Secondary | ICD-10-CM

## 2019-06-24 ENCOUNTER — Telehealth: Payer: Self-pay | Admitting: *Deleted

## 2019-06-24 NOTE — Telephone Encounter (Signed)
-----   Message from Wynn Banker, MD sent at 06/21/2019 10:31 AM EDT ----- Please set up lab visit and physical. Labs can be done any time. Physical ok to wait a few months.

## 2019-06-24 NOTE — Telephone Encounter (Signed)
I left a detailed message at the pts cell number to call back for appts as below. 

## 2019-08-22 ENCOUNTER — Other Ambulatory Visit: Payer: Self-pay | Admitting: Obstetrics and Gynecology

## 2019-08-22 DIAGNOSIS — Z1231 Encounter for screening mammogram for malignant neoplasm of breast: Secondary | ICD-10-CM

## 2019-09-06 ENCOUNTER — Other Ambulatory Visit: Payer: Self-pay

## 2019-09-06 ENCOUNTER — Ambulatory Visit
Admission: RE | Admit: 2019-09-06 | Discharge: 2019-09-06 | Disposition: A | Discharge status: Home / Self Care | Payer: No Typology Code available for payment source | Source: Ambulatory Visit | Attending: Obstetrics and Gynecology | Admitting: Obstetrics and Gynecology

## 2019-09-06 DIAGNOSIS — Z1231 Encounter for screening mammogram for malignant neoplasm of breast: Secondary | ICD-10-CM

## 2019-12-02 NOTE — Addendum Note (Signed)
Addended by: Lerry Liner on: 12/02/2019 03:14 PM   Modules accepted: Orders

## 2019-12-06 ENCOUNTER — Other Ambulatory Visit: Payer: No Typology Code available for payment source

## 2019-12-13 ENCOUNTER — Encounter: Payer: No Typology Code available for payment source | Admitting: Family Medicine

## 2020-01-07 ENCOUNTER — Other Ambulatory Visit (INDEPENDENT_AMBULATORY_CARE_PROVIDER_SITE_OTHER): Payer: Self-pay

## 2020-01-07 ENCOUNTER — Other Ambulatory Visit: Payer: Self-pay

## 2020-01-07 DIAGNOSIS — N924 Excessive bleeding in the premenopausal period: Secondary | ICD-10-CM

## 2020-01-07 DIAGNOSIS — Z131 Encounter for screening for diabetes mellitus: Secondary | ICD-10-CM

## 2020-01-07 DIAGNOSIS — R7989 Other specified abnormal findings of blood chemistry: Secondary | ICD-10-CM

## 2020-01-07 DIAGNOSIS — J302 Other seasonal allergic rhinitis: Secondary | ICD-10-CM

## 2020-01-07 DIAGNOSIS — Z13220 Encounter for screening for lipoid disorders: Secondary | ICD-10-CM

## 2020-01-08 ENCOUNTER — Telehealth: Payer: Self-pay | Admitting: Family Medicine

## 2020-01-08 LAB — CBC WITH DIFFERENTIAL/PLATELET
Absolute Monocytes: 752 cells/uL (ref 200–950)
Basophils Absolute: 64 cells/uL (ref 0–200)
Basophils Relative: 0.4 %
Eosinophils Absolute: 144 cells/uL (ref 15–500)
Eosinophils Relative: 0.9 %
HCT: 40.1 % (ref 35.0–45.0)
Hemoglobin: 13.3 g/dL (ref 11.7–15.5)
Lymphs Abs: 4464 cells/uL — ABNORMAL HIGH (ref 850–3900)
MCH: 29.4 pg (ref 27.0–33.0)
MCHC: 33.2 g/dL (ref 32.0–36.0)
MCV: 88.7 fL (ref 80.0–100.0)
MPV: 9.3 fL (ref 7.5–12.5)
Monocytes Relative: 4.7 %
Neutro Abs: 10576 cells/uL — ABNORMAL HIGH (ref 1500–7800)
Neutrophils Relative %: 66.1 %
Platelets: 399 10*3/uL (ref 140–400)
RBC: 4.52 10*6/uL (ref 3.80–5.10)
RDW: 12.5 % (ref 11.0–15.0)
Total Lymphocyte: 27.9 %
WBC: 16 10*3/uL — ABNORMAL HIGH (ref 3.8–10.8)

## 2020-01-08 LAB — COMPREHENSIVE METABOLIC PANEL
AG Ratio: 1.3 (calc) (ref 1.0–2.5)
ALT: 15 U/L (ref 6–29)
AST: 16 U/L (ref 10–35)
Albumin: 3.7 g/dL (ref 3.6–5.1)
Alkaline phosphatase (APISO): 95 U/L (ref 31–125)
BUN: 16 mg/dL (ref 7–25)
CO2: 14 mmol/L — ABNORMAL LOW (ref 20–32)
Calcium: 8.9 mg/dL (ref 8.6–10.2)
Chloride: 108 mmol/L (ref 98–110)
Creat: 0.64 mg/dL (ref 0.50–1.10)
Globulin: 2.9 g/dL (calc) (ref 1.9–3.7)
Glucose, Bld: 65 mg/dL (ref 65–99)
Potassium: 4.7 mmol/L (ref 3.5–5.3)
Sodium: 141 mmol/L (ref 135–146)
Total Bilirubin: 0.4 mg/dL (ref 0.2–1.2)
Total Protein: 6.6 g/dL (ref 6.1–8.1)

## 2020-01-08 LAB — LIPID PANEL
Cholesterol: 196 mg/dL (ref <200)
HDL: 61 mg/dL (ref >=50)
LDL Cholesterol (Calc): 114 mg/dL (calc) — ABNORMAL HIGH
Non-HDL Cholesterol (Calc): 135 mg/dL (calc) — ABNORMAL HIGH (ref <130)
Total CHOL/HDL Ratio: 3.2 (calc) (ref <5.0)
Triglycerides: 104 mg/dL (ref <150)

## 2020-01-08 LAB — IRON,TIBC AND FERRITIN PANEL
%SAT: 27 % (calc) (ref 16–45)
Ferritin: 67 ng/mL (ref 16–232)
Iron: 81 ug/dL (ref 40–190)
TIBC: 300 mcg/dL (calc) (ref 250–450)

## 2020-01-08 LAB — TSH: TSH: 1.4 mIU/L

## 2020-01-08 NOTE — Telephone Encounter (Signed)
Patient is calling and stated that she just received her lab results on [facility] and wanted to speak to someone, please advise. CB is [phone]

## 2020-01-10 NOTE — Telephone Encounter (Signed)
Patient was contacted, but not reached. See results note.

## 2020-01-13 ENCOUNTER — Other Ambulatory Visit (INDEPENDENT_AMBULATORY_CARE_PROVIDER_SITE_OTHER): Payer: Self-pay

## 2020-01-13 ENCOUNTER — Encounter: Payer: Self-pay | Admitting: Family Medicine

## 2020-01-13 ENCOUNTER — Ambulatory Visit (INDEPENDENT_AMBULATORY_CARE_PROVIDER_SITE_OTHER): Payer: No Typology Code available for payment source

## 2020-01-13 ENCOUNTER — Other Ambulatory Visit: Payer: Self-pay

## 2020-01-13 ENCOUNTER — Ambulatory Visit (INDEPENDENT_AMBULATORY_CARE_PROVIDER_SITE_OTHER): Payer: Self-pay | Admitting: Family Medicine

## 2020-01-13 VITALS — BP 132/82 | HR 110 | Temp 99.6°F | Ht 60.0 in | Wt 232.8 lb

## 2020-01-13 DIAGNOSIS — R0602 Shortness of breath: Secondary | ICD-10-CM

## 2020-01-13 DIAGNOSIS — J309 Allergic rhinitis, unspecified: Secondary | ICD-10-CM

## 2020-01-13 DIAGNOSIS — M2550 Pain in unspecified joint: Secondary | ICD-10-CM

## 2020-01-13 DIAGNOSIS — Z23 Encounter for immunization: Secondary | ICD-10-CM

## 2020-01-13 DIAGNOSIS — Z0000 Encounter for general adult medical examination without abnormal findings: Secondary | ICD-10-CM

## 2020-01-13 LAB — CBC WITH DIFFERENTIAL/PLATELET
Basophils Absolute: 0 10*3/uL (ref 0.0–0.1)
Basophils Relative: 0.3 % (ref 0.0–3.0)
Eosinophils Absolute: 0.2 10*3/uL (ref 0.0–0.7)
Eosinophils Relative: 1.4 % (ref 0.0–5.0)
HCT: 42.4 % (ref 36.0–46.0)
Hemoglobin: 13.9 g/dL (ref 12.0–15.0)
Lymphocytes Relative: 19.4 % (ref 12.0–46.0)
Lymphs Abs: 3.2 10*3/uL (ref 0.7–4.0)
MCHC: 32.8 g/dL (ref 30.0–36.0)
MCV: 87.4 fl (ref 78.0–100.0)
Monocytes Absolute: 0.7 10*3/uL (ref 0.1–1.0)
Monocytes Relative: 4.4 % (ref 3.0–12.0)
Neutro Abs: 12.5 10*3/uL — ABNORMAL HIGH (ref 1.4–7.7)
Neutrophils Relative %: 74.5 % (ref 43.0–77.0)
Platelets: 366 10*3/uL (ref 150.0–400.0)
RBC: 4.85 Mil/uL (ref 3.87–5.11)
RDW: 14.4 % (ref 11.5–15.5)
WBC: 16.7 10*3/uL — ABNORMAL HIGH (ref 4.0–10.5)

## 2020-01-13 LAB — URIC ACID: Uric Acid, Serum: 5.9 mg/dL (ref 2.4–7.0)

## 2020-01-13 LAB — SEDIMENTATION RATE: Sed Rate: 63 mm/hr — ABNORMAL HIGH (ref 0–20)

## 2020-01-13 LAB — C-REACTIVE PROTEIN: CRP: 2.9 mg/dL (ref 0.5–20.0)

## 2020-01-13 NOTE — Progress Notes (Signed)
Carolin Exum Baird DOB: 1975/06/11 Encounter date: 01/13/2020  This is a 44 y.o. female who presents for complete physical   History of present illness/Additional concerns: Had COVID pnsumonia, but still every day feels fatigued like she needs nap. Has some great days, but then has days intermittently where she feels short of breath. Joints hurt. Shoulder, hip; felt like every joint. Nothing now on left side, but does feel it on right side. Last night knee so painful she almost went to hospital. Aching, feels cold.   Prior to COVID had some tenderness right axilla; saw Grewal, did mammogram. Still has tenderness upper right breast up into chest.  Not had redness in joints, but does have swelling; no hx of gout. Feels so cold inside. Not cold to touch. Feeling pain in bone. Nothing in ankle. Right hip pain - felt like it was radiating from knee last night. About a month ago had joint pain on left side as well; would note with standing, bending, picking things up. Really was limiting activity. In last two weeks notes stiffness in morning - just knee. Once she is going then pain is back and by night she has to get off of knee. Right shoulder a little achy; but pain is more down into armpit.  Hasn't taken anything for pain.   Sx only on right side.   Seasonal allergies: does well with claritin. Hasn't had trouble with this.   Follows with GI for history of kidney stones  Follows with Dr. Vincente Poli for gynecology needs.   Mammogram completed 09/10/2019.  This was normal.   Past Medical History:  Diagnosis Date  . Gallstones   . History of kidney stones   . Mild acid reflux 04/2015  . PONV (postoperative nausea and vomiting)    Past Surgical History:  Procedure Laterality Date  . ABLATION     uterus; done for excessive bleeding  . CHOLECYSTECTOMY  2000  . DILATION AND CURETTAGE OF UTERUS    . EXTRACORPOREAL SHOCK WAVE LITHOTRIPSY Left 03/14/2016   Procedure: LEFT EXTRACORPOREAL SHOCK WAVE  LITHOTRIPSY (ESWL);  Surgeon: Bjorn Pippin, MD;  Location: [facility];  Service: Urology;  Laterality: Left;  . LITHOTRIPSY    . TUBAL LIGATION  2005   No Known Allergies Current Meds  Medication Sig  . Ascorbic Acid (VITAMIN C PO) Take by mouth daily.  Marland Kitchen FLUoxetine (PROZAC) 20 MG capsule Take 20 mg by mouth as needed (for anxiety).   Marland Kitchen loratadine (CLARITIN) 10 MG tablet Take 10 mg by mouth daily.  . Multiple Vitamins-Minerals (ZINC PO) Take by mouth every other day.  . traMADol (ULTRAM) 50 MG tablet Take 50 mg by mouth 2 (two) times daily as needed for cramping.   Social History   Tobacco Use  . Smoking status: Former Smoker    Quit date: 12/09/2008    Years since quitting: 11.1  . Smokeless tobacco: Never Used  Substance Use Topics  . Alcohol use: No   Family History  Problem Relation Age of Onset  . Diabetes Maternal Aunt   . Heart disease Maternal Grandfather   . COPD Maternal Grandfather   . Hypertension Maternal Grandfather   . Heart disease Maternal Uncle        x 2  . Irritable bowel syndrome Mother   . Thyroid disease Mother   . Asthma Mother   . Hypertension Mother   . Hyperlipidemia Mother   . Cancer Maternal Aunt        kidney  .  Other Father        unknown by patient  . Healthy Sister   . Hypertension Maternal Grandmother   . Stroke Maternal Grandmother 60  . Breast cancer Neg Hx      Review of Systems  Constitutional: Negative for activity change, appetite change, chills, fatigue, fever and unexpected weight change.  HENT: Negative for congestion, ear pain, hearing loss, sinus pressure, sinus pain, sore throat and trouble swallowing.   Eyes: Negative for pain and visual disturbance.  Respiratory: Positive for shortness of breath (intermittent). Negative for cough, chest tightness and wheezing.   Cardiovascular: Negative for chest pain, palpitations and leg swelling.  Gastrointestinal: Negative for abdominal pain, blood in stool, constipation, diarrhea,  nausea and vomiting.  Genitourinary: Negative for difficulty urinating and menstrual problem.  Musculoskeletal: Positive for arthralgias. Negative for back pain.  Skin: Negative for rash.  Neurological: Negative for dizziness, weakness, numbness and headaches.  Hematological: Negative for adenopathy. Does not bruise/bleed easily.  Psychiatric/Behavioral: Negative for sleep disturbance and suicidal ideas. The patient is not nervous/anxious.     CBC:  Lab Results  Component Value Date   WBC 16.0 (H) 01/07/2020   HGB 13.3 01/07/2020   HCT 40.1 01/07/2020   MCH 29.4 01/07/2020   MCHC 33.2 01/07/2020   RDW 12.5 01/07/2020   PLT 399 01/07/2020   MPV 9.3 01/07/2020   CMP: Lab Results  Component Value Date   NA 141 01/07/2020   K 4.7 01/07/2020   CL 108 01/07/2020   CO2 14 (L) 01/07/2020   ANIONGAP 10 04/01/2019   GLUCOSE 65 01/07/2020   BUN 16 01/07/2020   CREATININE 0.64 01/07/2020   GFRAA >60 04/01/2019   CALCIUM 8.9 01/07/2020   PROT 6.6 01/07/2020   BILITOT 0.4 01/07/2020   ALKPHOS 64 04/01/2019   ALT 15 01/07/2020   AST 16 01/07/2020   LIPID: Lab Results  Component Value Date   CHOL 196 01/07/2020   TRIG 104 01/07/2020   HDL 61 01/07/2020   LDLCALC 114 (H) 01/07/2020    Objective:  BP 132/82 (BP Location: Left Arm, Patient Position: Sitting, Cuff Size: Large)   Pulse (!) 110   Temp 99.6 F (37.6 C) (Oral)   Ht 5' (1.524 m)   Wt 232 lb 12.8 oz (105.6 kg)   LMP 12/30/2019 (Exact Date)   BMI 45.47 kg/m   Weight: 232 lb 12.8 oz (105.6 kg)   BP Readings from Last 3 Encounters:  01/13/20 132/82  04/01/19 (!) 121/47  05/02/16 106/77   Wt Readings from Last 3 Encounters:  01/13/20 232 lb 12.8 oz (105.6 kg)  06/21/19 226 lb 3.2 oz (102.6 kg)  03/30/19 233 lb (105.7 kg)    Physical Exam Constitutional:      General: She is not in acute distress.    Appearance: She is well-developed.  HENT:     Head: Normocephalic and atraumatic.     Right Ear:  External ear normal.     Left Ear: External ear normal.     Mouth/Throat:     Pharynx: No oropharyngeal exudate.  Eyes:     Conjunctiva/sclera: Conjunctivae normal.     Pupils: Pupils are equal, round, and reactive to light.  Neck:     Thyroid: No thyromegaly.  Cardiovascular:     Rate and Rhythm: Normal rate and regular rhythm.     Heart sounds: Normal heart sounds. No murmur heard.  No friction rub. No gallop.   Pulmonary:     Effort:  Pulmonary effort is normal.     Breath sounds: Normal breath sounds.  Chest:     Breasts:        Right: Tenderness present. No inverted nipple, mass, nipple discharge or skin change.        Left: No inverted nipple, mass, nipple discharge, skin change or tenderness.       Comments: Tenderness in marked area within muscle; no lymphadnopathy appreciated.  Abdominal:     General: Bowel sounds are normal. There is no distension.     Palpations: Abdomen is soft. There is no mass.     Tenderness: There is no abdominal tenderness. There is no guarding.     Hernia: No hernia is present.  Musculoskeletal:        General: No tenderness or deformity. Normal range of motion.     Cervical back: Normal range of motion and neck supple.     Comments: Pain with full extension or flexion greater than 100 deg. There is lateral > medial JLT. There is pain with patellar palpation. Lateral pain with pressure. She has diffculty with walking/stepping up to table.   Lymphadenopathy:     Head:     Right side of head: No submental or submandibular adenopathy.     Left side of head: No submental or submandibular adenopathy.     Cervical: No cervical adenopathy.     Right cervical: No superficial, deep or posterior cervical adenopathy.    Left cervical: No superficial, deep or posterior cervical adenopathy.     Upper Body:     Right upper body: No supraclavicular or axillary adenopathy.     Left upper body: No supraclavicular or axillary adenopathy.     Lower Body: No  right inguinal adenopathy. No left inguinal adenopathy.  Skin:    General: Skin is warm and dry.     Findings: No rash.  Neurological:     Mental Status: She is alert and oriented to person, place, and time.     Deep Tendon Reflexes: Reflexes normal.     Reflex Scores:      Tricep reflexes are 2+ on the right side and 2+ on the left side.      Bicep reflexes are 2+ on the right side and 2+ on the left side.      Brachioradialis reflexes are 2+ on the right side and 2+ on the left side.      Patellar reflexes are 2+ on the right side and 2+ on the left side. Psychiatric:        Speech: Speech normal.        Behavior: Behavior normal.        Thought Content: Thought content normal.     Assessment/Plan: Health Maintenance Due  Topic Date Due  . Hepatitis C Screening  Never done  . TETANUS/TDAP  Never done  . INFLUENZA VACCINE  09/29/2019   Health Maintenance reviewed.  1. Preventative health care Flu shot given today; she is due for tdap which we can complete at follow up visit.  2. Allergic rhinitis, unspecified seasonality, unspecified trigger Stable with current  claritin.  3. Arthralgia, unspecified joint I am concerned for ongoing arthralgia. Uncertain etiology. Will complete some additional bloodwork today. May be related to Post COVID syndrome. - DG Knee Complete 4 Views Right; Future - CBC with Differential/Platelet; Future - Pathologist smear review; Future - Sedimentation rate; Future - Uric Acid; Future - C-reactive Protein; Future - ANA; Future - Rheumatoid Factor; Future  4. Shortness of breath Normal lung exam. Will repeat cxr and consider further eval pending results. - DG Chest 2 View; Future - CBC with Differential/Platelet; Future - Pathologist smear review; Future  Return for pending bloodwork.  Theodis Shove, MD

## 2020-01-14 LAB — RHEUMATOID FACTOR: Rheumatoid fact SerPl-aCnc: <10 IU/mL (ref 0.0–13.9)

## 2020-01-14 LAB — ANA: Anti Nuclear Antibody (ANA): NEGATIVE

## 2020-01-16 ENCOUNTER — Telehealth: Payer: Self-pay | Admitting: Family Medicine

## 2020-01-16 NOTE — Telephone Encounter (Signed)
Pt would like to have a call back to discuss the xray results and the comments that was released in her mychart b/c she would like to know if she can take anything for the pain.

## 2020-01-17 ENCOUNTER — Other Ambulatory Visit: Payer: Self-pay | Admitting: Family Medicine

## 2020-01-17 MED ORDER — PREDNISONE 20 MG PO TABS: ORAL_TABLET | ORAL | 0 refills | Status: DC

## 2020-01-17 NOTE — Telephone Encounter (Signed)
See result note.  

## 2020-01-17 NOTE — Progress Notes (Signed)
Noted. Will consider further eval pending progress update after prednisone.

## 2020-01-17 NOTE — Telephone Encounter (Signed)
See result note on her.

## 2020-02-11 ENCOUNTER — Other Ambulatory Visit: Payer: Self-pay | Admitting: Obstetrics and Gynecology

## 2020-02-11 DIAGNOSIS — N644 Mastodynia: Secondary | ICD-10-CM

## 2020-03-25 ENCOUNTER — Ambulatory Visit
Admission: RE | Admit: 2020-03-25 | Discharge: 2020-03-25 | Disposition: A | Discharge status: Home / Self Care | Payer: No Typology Code available for payment source | Source: Ambulatory Visit | Attending: Obstetrics and Gynecology | Admitting: Obstetrics and Gynecology

## 2020-03-25 ENCOUNTER — Other Ambulatory Visit: Payer: Self-pay

## 2020-03-25 ENCOUNTER — Ambulatory Visit: Payer: No Typology Code available for payment source

## 2020-03-25 DIAGNOSIS — N644 Mastodynia: Secondary | ICD-10-CM

## 2020-03-27 ENCOUNTER — Ambulatory Visit: Payer: Self-pay | Admitting: Family Medicine

## 2020-07-27 ENCOUNTER — Ambulatory Visit (INDEPENDENT_AMBULATORY_CARE_PROVIDER_SITE_OTHER): Payer: No Typology Code available for payment source

## 2020-07-27 ENCOUNTER — Ambulatory Visit (HOSPITAL_COMMUNITY)
Admission: EM | Admit: 2020-07-27 | Discharge: 2020-07-27 | Disposition: A | Discharge status: Home / Self Care | Payer: No Typology Code available for payment source | Attending: Physician Assistant | Admitting: Physician Assistant

## 2020-07-27 ENCOUNTER — Other Ambulatory Visit: Payer: Self-pay

## 2020-07-27 ENCOUNTER — Encounter (HOSPITAL_COMMUNITY): Payer: Self-pay

## 2020-07-27 DIAGNOSIS — J189 Pneumonia, unspecified organism: Secondary | ICD-10-CM | POA: Insufficient documentation

## 2020-07-27 DIAGNOSIS — Z20822 Contact with and (suspected) exposure to covid-19: Secondary | ICD-10-CM | POA: Insufficient documentation

## 2020-07-27 DIAGNOSIS — R0602 Shortness of breath: Secondary | ICD-10-CM | POA: Insufficient documentation

## 2020-07-27 DIAGNOSIS — R059 Cough, unspecified: Secondary | ICD-10-CM

## 2020-07-27 DIAGNOSIS — J069 Acute upper respiratory infection, unspecified: Secondary | ICD-10-CM | POA: Insufficient documentation

## 2020-07-27 DIAGNOSIS — J9801 Acute bronchospasm: Secondary | ICD-10-CM | POA: Insufficient documentation

## 2020-07-27 DIAGNOSIS — Z87891 Personal history of nicotine dependence: Secondary | ICD-10-CM | POA: Insufficient documentation

## 2020-07-27 LAB — SARS CORONAVIRUS 2 (TAT 6-24 HRS): SARS Coronavirus 2: NEGATIVE

## 2020-07-27 LAB — POC INFLUENZA A AND B ANTIGEN (URGENT CARE ONLY)
INFLUENZA A ANTIGEN, POC: NEGATIVE
INFLUENZA B ANTIGEN, POC: NEGATIVE

## 2020-07-27 MED ORDER — PREDNISONE 10 MG (21) PO TBPK: ORAL_TABLET | ORAL | 0 refills | Status: AC

## 2020-07-27 MED ORDER — PROMETHAZINE-DM 6.25-15 MG/5ML PO SYRP: 5.0000 mL | ORAL_SOLUTION | Freq: Two times a day (BID) | ORAL | 0 refills | Status: AC | PRN

## 2020-07-27 MED ORDER — ALBUTEROL SULFATE HFA 108 (90 BASE) MCG/ACT IN AERS
2.0000 | INHALATION_SPRAY | Freq: Once | RESPIRATORY_TRACT | Status: AC
  Administered 2020-07-27: 2 via RESPIRATORY_TRACT

## 2020-07-27 MED ORDER — DOXYCYCLINE HYCLATE 100 MG PO CAPS: 100.0000 mg | ORAL_CAPSULE | Freq: Two times a day (BID) | ORAL | 0 refills | Status: AC

## 2020-07-27 MED ORDER — ALBUTEROL SULFATE HFA 108 (90 BASE) MCG/ACT IN AERS
INHALATION_SPRAY | RESPIRATORY_TRACT | Status: AC
  Filled 2020-07-27: qty 6.7

## 2020-07-27 NOTE — ED Provider Notes (Signed)
[facility]    CSN: [id number] Arrival date & time: 07/27/20  1218      History   Chief Complaint Chief Complaint  Patient presents with  . Cough  . Nasal Congestion  . Shortness of Breath    HPI Melody Taylor is a 45 y.o. female.   Patient presents today with a 5-day history of cough.  She reports associated shortness of breath and wheezing.  She has also developed fever, nasal congestion, fatigue.  She denies any chest pain, nausea, vomiting, dizziness, syncope.  She has tried Tylenol and ibuprofen as well as Mucinex without improvement of symptoms.  Reports grandchildren sick with similar symptoms.  She has had worsening shortness of breath prompting evaluation today.  She has a history of COVID-19 pneumonia in 2021 and states current symptoms are similar to previous episodes this condition.  She has taken at home COVID tests that were negative.  She denies any recent antibiotic use.  She has been using albuterol inhaler with minimal improvement of symptoms.  Denies history of asthma but does require albuterol inhaler during illness for bronchitis symptoms.  She is a former smoker.  She is having difficulty with daily activities including sleeping through the night as result of symptoms.     Past Medical History:  Diagnosis Date  . Gallstones   . History of kidney stones   . Mild acid reflux 04/2015  . PONV (postoperative nausea and vomiting)     Patient Active Problem List   Diagnosis Date Noted  . Pain in joint, lower leg 03/05/2015  . Allergic rhinitis 05/02/2014  . Morbid obesity (HCC) 05/02/2014    Past Surgical History:  Procedure Laterality Date  . ABLATION     uterus; done for excessive bleeding  . CHOLECYSTECTOMY  2000  . DILATION AND CURETTAGE OF UTERUS    . EXTRACORPOREAL SHOCK WAVE LITHOTRIPSY Left 03/14/2016   Procedure: LEFT EXTRACORPOREAL SHOCK WAVE LITHOTRIPSY (ESWL);  Surgeon: Bjorn Pippin, MD;  Location: [facility];  Service: Urology;   Laterality: Left;  . LITHOTRIPSY    . TUBAL LIGATION  2005    OB History   No obstetric history on file.      Home Medications    Prior to Admission medications   Medication Sig Start Date End Date Taking? Authorizing Provider  cetirizine (ZYRTEC) 10 MG tablet Take 10 mg by mouth daily.   Yes [provider]  doxycycline (VIBRAMYCIN) 100 MG capsule Take 1 capsule (100 mg total) by mouth 2 (two) times daily. 07/27/20  Yes Francoise Chojnowski K, PA-C  predniSONE (STERAPRED UNI-PAK 21 TAB) 10 MG (21) TBPK tablet As directed 07/27/20  Yes Duke Weisensel K, PA-C  promethazine-dextromethorphan (PROMETHAZINE-DM) 6.25-15 MG/5ML syrup Take 5 mLs by mouth 2 (two) times daily as needed for cough. 07/27/20  Yes Kareemah Grounds K, PA-C  Turmeric 500 MG TABS Take by mouth.   Yes [provider]  Zinc Sulfate (ZINC 15 PO) Take by mouth.   Yes [provider]  Ascorbic Acid (VITAMIN C PO) Take by mouth daily.    [provider]  FLUoxetine (PROZAC) 20 MG capsule Take 20 mg by mouth as needed (for anxiety).     [provider]    Family History Family History  Problem Relation Age of Onset  . Diabetes Maternal Aunt   . Heart disease Maternal Grandfather   . COPD Maternal Grandfather   . Hypertension Maternal Grandfather   . Heart disease Maternal Uncle  x 2  . Irritable bowel syndrome Mother   . Thyroid disease Mother   . Asthma Mother   . Hypertension Mother   . Hyperlipidemia Mother   . Cancer Maternal Aunt        kidney  . Other Father        unknown by patient  . Healthy Sister   . Hypertension Maternal Grandmother   . Stroke Maternal Grandmother 60  . Breast cancer Neg Hx     Social History Social History   Tobacco Use  . Smoking status: Former Smoker    Quit date: 12/09/2008    Years since quitting: 11.6  . Smokeless tobacco: Never Used  Vaping Use  . Vaping Use: Never used  Substance Use Topics  . Alcohol use: No  . Drug use: No      Allergies   Patient has no known allergies.   Review of Systems Review of Systems  Constitutional: Positive for activity change. Negative for appetite change, fatigue and fever.  HENT: Positive for congestion. Negative for sinus pressure, sneezing and sore throat.   Respiratory: Positive for cough, chest tightness and shortness of breath.   Cardiovascular: Negative for chest pain and palpitations.  Gastrointestinal: Positive for diarrhea. Negative for abdominal pain, nausea and vomiting.  Musculoskeletal: Negative for arthralgias and myalgias.  Neurological: Positive for headaches. Negative for dizziness and light-headedness.     Physical Exam Triage Vital Signs ED Triage Vitals  Enc Vitals Group     BP 07/27/20 1303 109/61     Pulse Rate 07/27/20 1303 (!) 106     Resp 07/27/20 1303 (!) 21     Temp 07/27/20 1303 99.3 F (37.4 C)     Temp Source 07/27/20 1303 Oral     SpO2 07/27/20 1303 98 %     Weight --      Height --      Head Circumference --      Peak Flow --      Pain Score 07/27/20 1301 0     Pain Loc --      Pain Edu? --      Excl. in GC? --    No data found.  Updated Vital Signs BP 109/61 (BP Location: Left Arm)   Pulse (!) 106   Temp 99.3 F (37.4 C) (Oral)   Resp (!) 21   LMP  (Within Months) Comment: 1 month   SpO2 98%   Visual Acuity Right Eye Distance:   Left Eye Distance:   Bilateral Distance:    Right Eye Near:   Left Eye Near:    Bilateral Near:     Physical Exam Vitals reviewed.  Constitutional:      General: She is awake. She is not in acute distress.    Appearance: Normal appearance. She is overweight. She is not ill-appearing.     Comments: Very pleasant female appears stated age in no acute distress  HENT:     Head: Normocephalic and atraumatic.     Right Ear: Tympanic membrane, ear canal and external ear normal. Tympanic membrane is not erythematous or bulging.     Left Ear: Tympanic membrane, ear canal and external ear  normal. Tympanic membrane is not erythematous or bulging.     Nose:     Right Sinus: No maxillary sinus tenderness or frontal sinus tenderness.     Left Sinus: No maxillary sinus tenderness or frontal sinus tenderness.     Mouth/Throat:     Pharynx:  Uvula midline. Posterior oropharyngeal erythema present. No oropharyngeal exudate.     Comments: Moderate erythema and drainage in posterior oropharynx Cardiovascular:     Rate and Rhythm: Normal rate and regular rhythm.     Heart sounds: Normal heart sounds. No murmur heard.   Pulmonary:     Effort: Pulmonary effort is normal.     Breath sounds: Wheezing and rhonchi present. No rales.     Comments: Widespread wheezing and rhonchi that partially clear with cough Abdominal:     Palpations: Abdomen is soft.     Tenderness: There is no abdominal tenderness.  Musculoskeletal:     Right lower leg: No edema.     Left lower leg: No edema.  Lymphadenopathy:     Head:     Right side of head: No submental, submandibular or tonsillar adenopathy.     Left side of head: No submental, submandibular or tonsillar adenopathy.     Cervical: No cervical adenopathy.  Psychiatric:        Behavior: Behavior is cooperative.      UC Treatments / Results  Labs (all labs ordered are listed, but only abnormal results are displayed) Labs Reviewed  SARS CORONAVIRUS 2 (TAT 6-24 HRS)  POC INFLUENZA A AND B ANTIGEN (URGENT CARE ONLY)    EKG   Radiology DG Chest 2 View  Result Date: 07/27/2020 CLINICAL DATA:  45 year old female with history of shortness of breath and worsening cough. EXAM: CHEST - 2 VIEW COMPARISON:  Chest x-ray 01/13/2020. FINDINGS: Patchy multifocal ill-defined opacities and areas of interstitial prominence are noted throughout the mid to lower lungs bilaterally (right greater than left), concerning for multilobar bilateral bronchopneumonia. No pleural effusions. No pneumothorax. No evidence of pulmonary edema. Heart size is normal. Upper  mediastinal contours are within normal limits. IMPRESSION: 1. The appearance of the lungs is most compatible with severe multilobar bilateral bronchopneumonia. Clinical correlation for signs and symptoms of viral infection is recommended. Electronically Signed   By: Trudie Reed M.D.   On: 07/27/2020 14:16    Procedures Procedures (including critical care time)  Medications Ordered in UC Medications  albuterol (VENTOLIN HFA) 108 (90 Base) MCG/ACT inhaler 2 puff (2 puffs Inhalation Given 07/27/20 1334)    Initial Impression / Assessment and Plan / UC Course  I have reviewed the triage vital signs and the nursing notes.  Pertinent labs & imaging results that were available during my care of the patient were reviewed by me and considered in my medical decision making (see chart for details).     X-ray showed likely multilobar and bilateral bronchopneumonia.  Vital signs are stable with oxygen saturation of 98%.  Flu testing is negative and COVID-19 testing is pending.  We discussed possible treatment options including going directly to the emergency room versus starting medication.  Patient is interested in short trial of medication and so was started on doxycycline and prednisone taper.  She was instructed not to take NSAIDs with prednisone due to risk of GI bleeding.  She was prescribed promethazine DM with instruction not to drive or drink alcohol with this medication as drowsiness is a common side effect.  We discussed that if she does not have improvement within 12 to 24 hours with current medication regimen or if anything worsens she is to go directly to the emergency room to which she expressed understanding.  Discussed at length alarm symptoms that warrant emergent evaluation.  Strict return precautions given to which patient expressed understanding.  Final Clinical  Impressions(s) / UC Diagnoses   Final diagnoses:  Viral URI with cough  Shortness of breath  Bronchospasm  Pneumonia  of both lungs due to infectious organism, unspecified part of lung     Discharge Instructions     Your x-ray indicated that you likely have viral pneumonia.  We are going to start you on an antibiotic to cover for bacterial infection as well.  Please stay out of the sun while on doxycycline.  Take prednisone taper as instructed.  You should not take NSAIDs with this medication due to risk of GI bleeding (aspirin, ibuprofen/Advil, naproxen/Aleve).  I called in a cough medicine that can make you sleepy so do not drive or drink alcohol while taking this.  If you have any worsening symptoms or if symptoms do not improve within 24 hours I recommend going to the emergency room.    ED Prescriptions    Medication Sig Dispense Auth. Provider   predniSONE (STERAPRED UNI-PAK 21 TAB) 10 MG (21) TBPK tablet As directed 21 tablet Riyan Haile K, PA-C   doxycycline (VIBRAMYCIN) 100 MG capsule Take 1 capsule (100 mg total) by mouth 2 (two) times daily. 20 capsule Excell Neyland K, PA-C   promethazine-dextromethorphan (PROMETHAZINE-DM) 6.25-15 MG/5ML syrup Take 5 mLs by mouth 2 (two) times daily as needed for cough. 118 mL Calem Cocozza K, PA-C     PDMP not reviewed this encounter.   Jeani Hawking, PA-C 07/27/20 1427

## 2020-07-27 NOTE — ED Triage Notes (Signed)
Pt reports cough, nasal congestion, chest congestion shortness of breath x 5 days; fevr 102.0 F today. Mucinex and albuterol inhaler givesno relief.

## 2020-07-27 NOTE — Discharge Instructions (Signed)
Your x-ray indicated that you likely have viral pneumonia.  We are going to start you on an antibiotic to cover for bacterial infection as well.  Please stay out of the sun while on doxycycline.  Take prednisone taper as instructed.  You should not take NSAIDs with this medication due to risk of GI bleeding (aspirin, ibuprofen/Advil, naproxen/Aleve).  I called in a cough medicine that can make you sleepy so do not drive or drink alcohol while taking this.  If you have any worsening symptoms or if symptoms do not improve within 24 hours I recommend going to the emergency room.

## 2021-01-26 ENCOUNTER — Other Ambulatory Visit: Payer: Self-pay | Admitting: Obstetrics and Gynecology

## 2021-01-26 DIAGNOSIS — Z1231 Encounter for screening mammogram for malignant neoplasm of breast: Secondary | ICD-10-CM

## 2021-02-08 ENCOUNTER — Ambulatory Visit
Admission: RE | Admit: 2021-02-08 | Discharge: 2021-02-08 | Disposition: A | Discharge status: Home / Self Care | Payer: No Typology Code available for payment source | Source: Ambulatory Visit | Attending: Obstetrics and Gynecology | Admitting: Obstetrics and Gynecology

## 2021-02-08 DIAGNOSIS — Z1231 Encounter for screening mammogram for malignant neoplasm of breast: Secondary | ICD-10-CM

## 2021-11-05 NOTE — Progress Notes (Deleted)
Office Visit Note  Patient: Melody Taylor             Date of Birth: March 24, 1975           MRN: [id number]             PCP: Caren Macadam, MD (Inactive) Referring: Everardo Beals, NP Visit Date: 11/10/2021 Occupation: '@GUAROCC' @  Subjective:  No chief complaint on file.   History of Present Illness: Melody Taylor is a 46 y.o. female here for evaluation of bilateral hand swelling and decreased ROM with mildly elevated CRP.***   Labs reviewed 06/2021 ANA neg RF neg Uric acid 6.4 ESR 19 CRP 14.3 Lyme Abs neg  Activities of Daily Living:  Patient reports morning stiffness for *** {minute/hour:19697}.   Patient {ACTIONS;DENIES/REPORTS:21021675::"Denies"} nocturnal pain.  Difficulty dressing/grooming: {ACTIONS;DENIES/REPORTS:21021675::"Denies"} Difficulty climbing stairs: {ACTIONS;DENIES/REPORTS:21021675::"Denies"} Difficulty getting out of chair: {ACTIONS;DENIES/REPORTS:21021675::"Denies"} Difficulty using hands for taps, buttons, cutlery, and/or writing: {ACTIONS;DENIES/REPORTS:21021675::"Denies"}  No Rheumatology ROS completed.   PMFS History:  Patient Active Problem List   Diagnosis Date Noted   Pain in joint, lower leg 03/05/2015   Allergic rhinitis 05/02/2014   Morbid obesity ([place]) 05/02/2014    Past Medical History:  Diagnosis Date   Gallstones    History of kidney stones    Mild acid reflux 04/2015   PONV (postoperative nausea and vomiting)     Family History  Problem Relation Age of Onset   Diabetes Maternal Aunt    Heart disease Maternal Grandfather    COPD Maternal Grandfather    Hypertension Maternal Grandfather    Heart disease Maternal Uncle        x 2   Irritable bowel syndrome Mother    Thyroid disease Mother    Asthma Mother    Hypertension Mother    Hyperlipidemia Mother    Cancer Maternal Aunt        kidney   Other Father        unknown by patient   Healthy Sister    Hypertension Maternal Grandmother    Stroke Maternal  Grandmother 60   Breast cancer Neg Hx    Past Surgical History:  Procedure Laterality Date   ABLATION     uterus; done for excessive bleeding   CHOLECYSTECTOMY  2000   DILATION AND CURETTAGE OF UTERUS     EXTRACORPOREAL SHOCK WAVE LITHOTRIPSY Left 03/14/2016   Procedure: LEFT EXTRACORPOREAL SHOCK WAVE LITHOTRIPSY (ESWL);  Surgeon: Irine Seal, MD;  Location: [facility];  Service: Urology;  Laterality: Left;   LITHOTRIPSY     TUBAL LIGATION  2005   Social History   Social History Narrative   Not on file   Immunization History  Administered Date(s) Administered   Influenza,inj,Quad PF,6+ Mos 02/22/2014, 01/13/2020   Influenza-Unspecified 12/13/2014   PFIZER(Purple Top)SARS-COV-2 Vaccination 07/02/2019, 07/22/2019     Objective: Vital Signs: There were no vitals taken for this visit.   Physical Exam   Musculoskeletal Exam: ***  CDAI Exam: CDAI Score: -- Patient Global: --; Provider Global: -- Swollen: --; Tender: -- Joint Exam 11/10/2021   No joint exam has been documented for this visit   There is currently no information documented on the homunculus. Go to the Rheumatology activity and complete the homunculus joint exam.  Investigation: No additional findings.  Imaging: No results found.  Recent Labs: Lab Results  Component Value Date   WBC 16.7 (H) 01/13/2020   HGB 13.9 01/13/2020   PLT 366.0 01/13/2020   NA 141 01/07/2020  K 4.7 01/07/2020   CL 108 01/07/2020   CO2 14 (L) 01/07/2020   GLUCOSE 65 01/07/2020   BUN 16 01/07/2020   CREATININE 0.64 01/07/2020   BILITOT 0.4 01/07/2020   ALKPHOS 64 04/01/2019   AST 16 01/07/2020   ALT 15 01/07/2020   PROT 6.6 01/07/2020   ALBUMIN 3.1 (L) 04/01/2019   CALCIUM 8.9 01/07/2020   GFRAA >60 04/01/2019    Speciality Comments: No specialty comments available.  Procedures:  No procedures performed Allergies: Patient has no known allergies.   Assessment / Plan:     Visit Diagnoses: No diagnosis  found.  Orders: No orders of the defined types were placed in this encounter.  No orders of the defined types were placed in this encounter.   Face-to-face time spent with patient was *** minutes. Greater than 50% of time was spent in counseling and coordination of care.  Follow-Up Instructions: No follow-ups on file.   Collier Salina, MD  Note - This record has been created using [employer].  Chart creation errors have been sought, but may not always  have been located. Such creation errors do not reflect on  the standard of medical care.

## 2021-11-10 ENCOUNTER — Ambulatory Visit: Payer: No Typology Code available for payment source | Admitting: Internal Medicine

## 2022-02-07 NOTE — Progress Notes (Deleted)
Office Visit Note  Patient: Melody Taylor             Date of Birth: 30-Nov-1975           MRN: [id number]             PCP: Caren Macadam, MD (Inactive) Referring: Everardo Beals, NP Visit Date: 02/08/2022 Occupation: _0 @  Subjective:  No chief complaint on file.   History of Present Illness: Melody Taylor [place] is a 46 y.o. female here for evaluation of joint pain at multiple areas. PCP clinic exam noted swelling and decreased range of motion in both hands. Labs obtained were engative for ANA and RF but with mildly elevated CRP.***   Labs reviewed ANA neg RF neg ESR 19 CRP 14.3 Uric acid 6.4 Lyme Ab neg  Activities of Daily Living:  Patient reports morning stiffness for *** {minute/hour:19697}.   Patient {ACTIONS;DENIES/REPORTS:21021675::"Denies"} nocturnal pain.  Difficulty dressing/grooming: {ACTIONS;DENIES/REPORTS:21021675::"Denies"} Difficulty climbing stairs: {ACTIONS;DENIES/REPORTS:21021675::"Denies"} Difficulty getting out of chair: {ACTIONS;DENIES/REPORTS:21021675::"Denies"} Difficulty using hands for taps, buttons, cutlery, and/or writing: {ACTIONS;DENIES/REPORTS:21021675::"Denies"}  No Rheumatology ROS completed.   PMFS History:  Patient Active Problem List   Diagnosis Date Noted   Pain in joint, lower leg 03/05/2015   Allergic rhinitis 05/02/2014   Morbid obesity ([place]) 05/02/2014    Past Medical History:  Diagnosis Date   Gallstones    History of kidney stones    Mild acid reflux 04/2015   PONV (postoperative nausea and vomiting)     Family History  Problem Relation Age of Onset   Diabetes Maternal Aunt    Heart disease Maternal Grandfather    COPD Maternal Grandfather    Hypertension Maternal Grandfather    Heart disease Maternal Uncle        x 2   Irritable bowel syndrome Mother    Thyroid disease Mother    Asthma Mother    Hypertension Mother    Hyperlipidemia Mother    Cancer Maternal Aunt        kidney   Other Father         unknown by patient   Healthy Sister    Hypertension Maternal Grandmother    Stroke Maternal Grandmother 60   Breast cancer Neg Hx    Past Surgical History:  Procedure Laterality Date   ABLATION     uterus; done for excessive bleeding   CHOLECYSTECTOMY  2000   DILATION AND CURETTAGE OF UTERUS     EXTRACORPOREAL SHOCK WAVE LITHOTRIPSY Left 03/14/2016   Procedure: LEFT EXTRACORPOREAL SHOCK WAVE LITHOTRIPSY (ESWL);  Surgeon: Irine Seal, MD;  Location: [facility];  Service: Urology;  Laterality: Left;   LITHOTRIPSY     TUBAL LIGATION  2005   Social History   Social History Narrative   Not on file   Immunization History  Administered Date(s) Administered   Influenza,inj,Quad PF,6+ Mos 02/22/2014, 01/13/2020   Influenza-Unspecified 12/13/2014   PFIZER(Purple Top)SARS-COV-2 Vaccination 07/02/2019, 07/22/2019     Objective: Vital Signs: There were no vitals taken for this visit.   Physical Exam   Musculoskeletal Exam: ***  CDAI Exam: CDAI Score: -- Patient Global: --; Provider Global: -- Swollen: --; Tender: -- Joint Exam 02/08/2022   No joint exam has been documented for this visit   There is currently no information documented on the homunculus. Go to the Rheumatology activity and complete the homunculus joint exam.  Investigation: No additional findings.  Imaging: No results found.  Recent Labs: Lab Results  Component Value Date  WBC 16.7 (H) 01/13/2020   HGB 13.9 01/13/2020   PLT 366.0 01/13/2020   NA 141 01/07/2020   K 4.7 01/07/2020   CL 108 01/07/2020   CO2 14 (L) 01/07/2020   GLUCOSE 65 01/07/2020   BUN 16 01/07/2020   CREATININE 0.64 01/07/2020   BILITOT 0.4 01/07/2020   ALKPHOS 64 04/01/2019   AST 16 01/07/2020   ALT 15 01/07/2020   PROT 6.6 01/07/2020   ALBUMIN 3.1 (L) 04/01/2019   CALCIUM 8.9 01/07/2020   GFRAA >60 04/01/2019    Speciality Comments: No specialty comments available.  Procedures:  No procedures performed Allergies:  Patient has no known allergies.   Assessment / Plan:     Visit Diagnoses: No diagnosis found.  Orders: No orders of the defined types were placed in this encounter.  No orders of the defined types were placed in this encounter.   Face-to-face time spent with patient was *** minutes. Greater than 50% of time was spent in counseling and coordination of care.  Follow-Up Instructions: No follow-ups on file.   Collier Salina, MD  Note - This record has been created using [employer].  Chart creation errors have been sought, but may not always  have been located. Such creation errors do not reflect on  the standard of medical care.

## 2022-02-08 ENCOUNTER — Ambulatory Visit: Payer: Self-pay | Attending: Internal Medicine | Admitting: Internal Medicine

## 2022-08-25 ENCOUNTER — Other Ambulatory Visit (HOSPITAL_COMMUNITY): Payer: Self-pay | Admitting: Neurological Surgery

## 2022-08-25 DIAGNOSIS — M5416 Radiculopathy, lumbar region: Secondary | ICD-10-CM

## 2022-09-02 ENCOUNTER — Ambulatory Visit (HOSPITAL_COMMUNITY): Admission: RE | Admit: 2022-09-02 | Payer: Self-pay | Source: Ambulatory Visit

## 2022-09-02 ENCOUNTER — Encounter (HOSPITAL_COMMUNITY): Payer: Self-pay

## 2022-09-08 ENCOUNTER — Ambulatory Visit (HOSPITAL_COMMUNITY): Payer: Self-pay

## 2022-09-23 ENCOUNTER — Ambulatory Visit (HOSPITAL_COMMUNITY): Payer: Self-pay

## 2022-10-27 ENCOUNTER — Ambulatory Visit (HOSPITAL_COMMUNITY): Admission: RE | Admit: 2022-10-27 | Payer: Self-pay | Source: Ambulatory Visit

## 2023-06-29 ENCOUNTER — Other Ambulatory Visit (HOSPITAL_COMMUNITY): Payer: Self-pay | Admitting: Neurological Surgery

## 2023-06-29 DIAGNOSIS — M5416 Radiculopathy, lumbar region: Secondary | ICD-10-CM

## 2023-07-06 ENCOUNTER — Ambulatory Visit (HOSPITAL_COMMUNITY)
Admission: RE | Admit: 2023-07-06 | Discharge: 2023-07-06 | Disposition: A | Discharge status: Home / Self Care | Payer: Self-pay | Source: Ambulatory Visit | Attending: Neurological Surgery | Admitting: Neurological Surgery

## 2023-07-06 DIAGNOSIS — M5416 Radiculopathy, lumbar region: Secondary | ICD-10-CM | POA: Insufficient documentation

## 2023-08-01 ENCOUNTER — Encounter (HOSPITAL_BASED_OUTPATIENT_CLINIC_OR_DEPARTMENT_OTHER): Payer: Self-pay

## 2023-08-01 ENCOUNTER — Other Ambulatory Visit: Payer: Self-pay

## 2023-08-01 ENCOUNTER — Emergency Department (HOSPITAL_BASED_OUTPATIENT_CLINIC_OR_DEPARTMENT_OTHER)

## 2023-08-01 ENCOUNTER — Emergency Department (HOSPITAL_BASED_OUTPATIENT_CLINIC_OR_DEPARTMENT_OTHER)
Admission: EM | Admit: 2023-08-01 | Discharge: 2023-08-01 | Disposition: A | Discharge status: Home / Self Care | Attending: Emergency Medicine | Admitting: Emergency Medicine

## 2023-08-01 DIAGNOSIS — N132 Hydronephrosis with renal and ureteral calculous obstruction: Secondary | ICD-10-CM | POA: Insufficient documentation

## 2023-08-01 HISTORY — DX: Polycystic ovarian syndrome: E28.2

## 2023-08-01 LAB — URINALYSIS, ROUTINE W REFLEX MICROSCOPIC
Bilirubin Urine: NEGATIVE
Glucose, UA: NEGATIVE mg/dL
Ketones, ur: NEGATIVE mg/dL
Leukocytes,Ua: NEGATIVE
Nitrite: NEGATIVE
Protein, ur: 30 mg/dL — AB
RBC / HPF: >50 RBC/hpf (ref 0–5)
Specific Gravity, Urine: 1.026 (ref 1.005–1.030)
pH: 5.5 (ref 5.0–8.0)

## 2023-08-01 LAB — BASIC METABOLIC PANEL WITH GFR
Anion gap: 12 (ref 5–15)
BUN: 16 mg/dL (ref 6–20)
CO2: 23 mmol/L (ref 22–32)
Calcium: 9.4 mg/dL (ref 8.9–10.3)
Chloride: 104 mmol/L (ref 98–111)
Creatinine, Ser: 0.7 mg/dL (ref 0.44–1.00)
GFR, Estimated: >60 mL/min (ref >60)
Glucose, Bld: 103 mg/dL — ABNORMAL HIGH (ref 70–99)
Potassium: 3.8 mmol/L (ref 3.5–5.1)
Sodium: 139 mmol/L (ref 135–145)

## 2023-08-01 LAB — CBC
HCT: 39.2 % (ref 36.0–46.0)
Hemoglobin: 12.9 g/dL (ref 12.0–15.0)
MCH: 29.3 pg (ref 26.0–34.0)
MCHC: 32.9 g/dL (ref 30.0–36.0)
MCV: 89.1 fL (ref 80.0–100.0)
Platelets: 350 10*3/uL (ref 150–400)
RBC: 4.4 MIL/uL (ref 3.87–5.11)
RDW: 13 % (ref 11.5–15.5)
WBC: 10.2 10*3/uL (ref 4.0–10.5)
nRBC: 0 % (ref 0.0–0.2)

## 2023-08-01 LAB — PREGNANCY, URINE: Preg Test, Ur: NEGATIVE

## 2023-08-01 MED ORDER — OXYCODONE-ACETAMINOPHEN 5-325 MG PO TABS: 1.0000 | ORAL_TABLET | Freq: Four times a day (QID) | ORAL | 0 refills | Status: AC | PRN

## 2023-08-01 MED ORDER — KETOROLAC TROMETHAMINE 30 MG/ML IJ SOLN
30.0000 mg | Freq: Once | INTRAMUSCULAR | Status: AC
  Administered 2023-08-01: 30 mg via INTRAVENOUS
  Filled 2023-08-01: qty 1

## 2023-08-01 MED ORDER — NAPROXEN 375 MG PO TABS: 375.0000 mg | ORAL_TABLET | Freq: Two times a day (BID) | ORAL | 0 refills | Status: AC

## 2023-08-01 MED ORDER — FENTANYL CITRATE PF 50 MCG/ML IJ SOSY: 50.0000 ug | PREFILLED_SYRINGE | INTRAMUSCULAR | Status: DC | PRN

## 2023-08-01 MED ORDER — ONDANSETRON HCL 4 MG/2ML IJ SOLN
4.0000 mg | Freq: Once | INTRAMUSCULAR | Status: AC
  Administered 2023-08-01: 4 mg via INTRAVENOUS
  Filled 2023-08-01: qty 2

## 2023-08-01 MED ORDER — ONDANSETRON 4 MG PO TBDP: 4.0000 mg | ORAL_TABLET | Freq: Once | ORAL | Status: DC

## 2023-08-01 MED ORDER — ONDANSETRON 8 MG PO TBDP: 8.0000 mg | ORAL_TABLET | Freq: Three times a day (TID) | ORAL | 0 refills | Status: AC | PRN

## 2023-08-01 MED ORDER — FENTANYL CITRATE PF 50 MCG/ML IJ SOSY
50.0000 ug | PREFILLED_SYRINGE | INTRAMUSCULAR | Status: DC | PRN
  Administered 2023-08-01: 50 ug via INTRAVENOUS
  Filled 2023-08-01: qty 1

## 2023-08-01 MED ORDER — TAMSULOSIN HCL 0.4 MG PO CAPS: 0.4000 mg | ORAL_CAPSULE | Freq: Every day | ORAL | 0 refills | Status: AC

## 2023-08-01 MED ORDER — ONDANSETRON HCL 4 MG/2ML IJ SOLN: 4.0000 mg | Freq: Once | INTRAMUSCULAR | Status: DC

## 2023-08-01 NOTE — Discharge Instructions (Addendum)
 The CT scan showed a 4 mm kidney stone.  Take the medications as needed for pain.  Follow up with your urologist for further evaluation.

## 2023-08-01 NOTE — ED Provider Notes (Signed)
 [place] EMERGENCY DEPARTMENT AT [facility] Provider Note   CSN: [id number] Arrival date & time: 08/01/23  1855     History  Chief Complaint  Patient presents with   Flank Pain    Melody Taylor is a 48 y.o. female.   Flank Pain   Pt started having flank pain this am.  She thought it could be her menses initially.  The pain started getting much worse.  She has had kidney stones before and this feels like that.  No fever.  No vomiting.  Some nausea    Home Medications Prior to Admission medications   Medication Sig Start Date End Date Taking? Authorizing Provider  naproxen  (NAPROSYN ) 375 MG tablet Take 1 tablet (375 mg total) by mouth 2 (two) times daily. 08/01/23  Yes Trish Furl, MD  ondansetron  (ZOFRAN -ODT) 8 MG disintegrating tablet Take 1 tablet (8 mg total) by mouth every 8 (eight) hours as needed for nausea or vomiting. 08/01/23  Yes Trish Furl, MD  oxyCODONE -acetaminophen  (PERCOCET/ROXICET) 5-325 MG tablet Take 1 tablet by mouth every 6 (six) hours as needed for severe pain (pain score 7-10). 08/01/23  Yes Trish Furl, MD  tamsulosin  (FLOMAX ) 0.4 MG CAPS capsule Take 1 capsule (0.4 mg total) by mouth daily for 7 days. 08/01/23 08/08/23 Yes Trish Furl, MD  Ascorbic Acid  (VITAMIN C PO) Take by mouth daily.    [provider]  cetirizine (ZYRTEC) 10 MG tablet Take 10 mg by mouth daily.    [provider]  doxycycline  (VIBRAMYCIN ) 100 MG capsule Take 1 capsule (100 mg total) by mouth 2 (two) times daily. 07/27/20   Raspet, Erin K, PA-C  FLUoxetine  (PROZAC ) 20 MG capsule Take 20 mg by mouth as needed (for anxiety).     [provider]  predniSONE  (STERAPRED UNI-PAK 21 TAB) 10 MG (21) TBPK tablet As directed 07/27/20   Raspet, Erin K, PA-C  promethazine -dextromethorphan (PROMETHAZINE -DM) 6.25-15 MG/5ML syrup Take 5 mLs by mouth 2 (two) times daily as needed for cough. 07/27/20   Raspet, Erin K, PA-C  Turmeric 500 MG TABS Take by mouth.    [provider]  Zinc  Sulfate (ZINC  15 PO) Take by mouth.    [provider]      Allergies    Patient has no known allergies.    Review of Systems   Review of Systems  Genitourinary:  Positive for flank pain.    Physical Exam Updated Vital Signs BP (!) 145/99 (BP Location: Left Arm)   Pulse 88   Temp 97.8 F (36.6 C)   Resp 15   Ht 1.524 m (5')   Wt 108.9 kg   LMP 06/30/2023   SpO2 99%   BMI 46.87 kg/m  Physical Exam Vitals and nursing note reviewed.  Constitutional:      General: She is not in acute distress.    Appearance: She is well-developed.  HENT:     Head: Normocephalic and atraumatic.     Right Ear: External ear normal.     Left Ear: External ear normal.  Eyes:     General: No scleral icterus.       Right eye: No discharge.        Left eye: No discharge.     Conjunctiva/sclera: Conjunctivae normal.  Neck:     Trachea: No tracheal deviation.  Cardiovascular:     Rate and Rhythm: Normal rate and regular rhythm.  Pulmonary:     Effort: Pulmonary effort is normal. No  respiratory distress.     Breath sounds: Normal breath sounds. No stridor. No wheezing or rales.  Abdominal:     General: Bowel sounds are normal. There is no distension.     Palpations: Abdomen is soft.     Tenderness: There is no abdominal tenderness. There is left CVA tenderness. There is no guarding or rebound.  Musculoskeletal:        General: No tenderness or deformity.     Cervical back: Neck supple.  Skin:    General: Skin is warm and dry.     Findings: No rash.  Neurological:     General: No focal deficit present.     Mental Status: She is alert.     Cranial Nerves: No cranial nerve deficit, dysarthria or facial asymmetry.     Sensory: No sensory deficit.     Motor: No abnormal muscle tone or seizure activity.     Coordination: Coordination normal.  Psychiatric:        Mood and Affect: Mood normal.     ED Results / Procedures / Treatments   Labs (all labs  ordered are listed, but only abnormal results are displayed) Labs Reviewed  URINALYSIS, ROUTINE W REFLEX MICROSCOPIC - Abnormal; Notable for the following components:      Result Value   APPearance HAZY (*)    Hgb urine dipstick LARGE (*)    Protein, ur 30 (*)    Bacteria, UA RARE (*)    All other components within normal limits  BASIC METABOLIC PANEL WITH GFR - Abnormal; Notable for the following components:   Glucose, Bld 103 (*)    All other components within normal limits  PREGNANCY, URINE  CBC    EKG None  Radiology CT Renal Stone Study Result Date: 08/01/2023 CLINICAL DATA:  Left flank pain EXAM: CT ABDOMEN AND PELVIS WITHOUT CONTRAST TECHNIQUE: Multidetector CT imaging of the abdomen and pelvis was performed following the standard protocol without IV contrast. RADIATION DOSE REDUCTION: This exam was performed according to the departmental dose-optimization program which includes automated exposure control, adjustment of the mA and/or kV according to patient size and/or use of iterative reconstruction technique. COMPARISON:  Renal CT 05/02/2016 FINDINGS: Lower chest: No acute abnormality. Hepatobiliary: No focal liver abnormality is seen. Status post cholecystectomy. No biliary dilatation. Pancreas: Unremarkable. No pancreatic ductal dilatation or surrounding inflammatory changes. Spleen: Normal in size without focal abnormality. Adrenals/Urinary Tract: There is a 4 mm calculus at the left ureteral pelvic junction there is mild left-sided hydronephrosis. There is a single punctate calculus in the right kidney. There is a 2 cm cyst in the right kidney. Right kidney is otherwise within normal limits. Adrenal glands and bladder are within normal limits. Stomach/Bowel: Stomach is within normal limits. Appendix appears normal. No evidence of bowel wall thickening, distention, or inflammatory changes. There is sigmoid colon diverticulosis. Vascular/Lymphatic: No significant vascular findings are  present. No enlarged abdominal or pelvic lymph nodes. Reproductive: Uterus and bilateral adnexa are unremarkable. Other: No abdominal wall hernia or abnormality. No abdominopelvic ascites. Musculoskeletal: No fracture is seen. IMPRESSION: 1. 4 mm calculus at the left ureteropelvic junction with mild obstructive uropathy. 2. Nonobstructing right renal calculus. 3. Right renal cyst. No follow-up imaging recommended. 4. Sigmoid colon diverticulosis. Electronically Signed   By: Tyron Gallon M.D.   On: 08/01/2023 20:30    Procedures Procedures    Medications Ordered in ED Medications  fentaNYL  (SUBLIMAZE ) injection 50 mcg (50 mcg Intravenous Given 08/01/23 1925)  ondansetron  (ZOFRAN )  injection 4 mg (4 mg Intravenous Not Given 08/01/23 1951)  ondansetron  (ZOFRAN ) injection 4 mg (4 mg Intravenous Given 08/01/23 1924)  ketorolac  (TORADOL ) 30 MG/ML injection 30 mg (30 mg Intravenous Given 08/01/23 1952)    ED Course/ Medical Decision Making/ A&P Clinical Course as of 08/01/23 2106  Tue Aug 01, 2023  2034 Basic metabolic panel(!) CBC metabolic panel normal.  Urinalysis shows hematuria [JK]  2034 CT scan shows a 4 mm calculus at the left ureteropelvic junction [JK]    Clinical Course User Index [JK] Trish Furl, MD                                 Medical Decision Making Problems Addressed: Ureteral stone with hydronephrosis: acute illness or injury that poses a threat to life or bodily functions  Amount and/or Complexity of Data Reviewed Labs: ordered. Decision-making details documented in ED Course. Radiology: ordered and independent interpretation performed.  Risk Prescription drug management. Parenteral controlled substances.   Patient presented to the ED with complaints of acute flank pain.  Patient felt that her symptoms were related to previous kidney stones.  Patient did have tenderness palpation in the left costovertebral angle.  No fevers.  Urinalysis does not suggest urinary tract  infection or pyelonephritis.  Laboratory test without signs of any significant metabolic abnormalities.  No anemia.  CT scan did show 4 mm ureteral stone.  Patient was treated with IV Toradol .  She was given a dose of IV fentanyl  per nursing protocol.  She had significant relief of her symptoms.  Patient appears stable for discharge with outpatient pain management.        Final Clinical Impression(s) / ED Diagnoses Final diagnoses:  Ureteral stone with hydronephrosis    Rx / DC Orders ED Discharge Orders          Ordered    oxyCODONE -acetaminophen  (PERCOCET/ROXICET) 5-325 MG tablet  Every 6 hours PRN        08/01/23 2102    ondansetron  (ZOFRAN -ODT) 8 MG disintegrating tablet  Every 8 hours PRN        08/01/23 2102    naproxen  (NAPROSYN ) 375 MG tablet  2 times daily        08/01/23 2102    tamsulosin  (FLOMAX ) 0.4 MG CAPS capsule  Daily        08/01/23 2102              Trish Furl, MD 08/01/23 2106

## 2023-08-01 NOTE — ED Triage Notes (Signed)
 Pt presents with sudden onset of L flank pain and hematuria. She has associated nausea. Pt states she has had kidney stones in the past and these symptoms are similar. She has not taken any OTC medications.
